# Patient Record
Sex: Female | Born: 1955
Health system: Southern US, Community
[De-identification: ages and names within clinical notes are randomized; demographics above are authoritative.]

## PROBLEM LIST (undated history)

## (undated) DIAGNOSIS — R7303 Prediabetes: Secondary | ICD-10-CM

## (undated) DIAGNOSIS — F419 Anxiety disorder, unspecified: Secondary | ICD-10-CM

## (undated) DIAGNOSIS — F32A Depression, unspecified: Secondary | ICD-10-CM

## (undated) DIAGNOSIS — E7800 Pure hypercholesterolemia, unspecified: Secondary | ICD-10-CM

## (undated) DIAGNOSIS — I10 Essential (primary) hypertension: Secondary | ICD-10-CM

## (undated) DIAGNOSIS — E079 Disorder of thyroid, unspecified: Secondary | ICD-10-CM

## (undated) DIAGNOSIS — K219 Gastro-esophageal reflux disease without esophagitis: Secondary | ICD-10-CM

## (undated) DIAGNOSIS — F329 Major depressive disorder, single episode, unspecified: Secondary | ICD-10-CM

## (undated) HISTORY — PX: ABDOMINAL HYSTERECTOMY: SHX81

## (undated) HISTORY — PX: TUBAL LIGATION: SHX77

## (undated) HISTORY — PX: CHOLECYSTECTOMY: SHX55

---

## 2006-02-17 ENCOUNTER — Ambulatory Visit (HOSPITAL_COMMUNITY): Admission: RE | Admit: 2006-02-17 | Discharge: 2006-02-17 | Payer: Self-pay | Admitting: Family Medicine

## 2008-03-28 ENCOUNTER — Emergency Department (HOSPITAL_COMMUNITY): Admission: EM | Admit: 2008-03-28 | Discharge: 2008-03-28 | Payer: Self-pay | Admitting: Emergency Medicine

## 2010-02-09 ENCOUNTER — Encounter: Payer: Self-pay | Admitting: Family Medicine

## 2010-05-01 LAB — RAPID STREP SCREEN (MED CTR MEBANE ONLY): Streptococcus, Group A Screen (Direct): NEGATIVE

## 2014-02-24 ENCOUNTER — Emergency Department (HOSPITAL_COMMUNITY): Payer: BLUE CROSS/BLUE SHIELD

## 2014-02-24 ENCOUNTER — Emergency Department (HOSPITAL_COMMUNITY)
Admission: EM | Admit: 2014-02-24 | Discharge: 2014-02-24 | Discharge status: Against Medical Advice | Payer: BLUE CROSS/BLUE SHIELD | Attending: Family Medicine | Admitting: Family Medicine

## 2014-02-24 ENCOUNTER — Encounter (HOSPITAL_COMMUNITY): Payer: Self-pay | Admitting: Emergency Medicine

## 2014-02-24 DIAGNOSIS — Z87891 Personal history of nicotine dependence: Secondary | ICD-10-CM | POA: Insufficient documentation

## 2014-02-24 DIAGNOSIS — I10 Essential (primary) hypertension: Secondary | ICD-10-CM | POA: Insufficient documentation

## 2014-02-24 DIAGNOSIS — E780 Pure hypercholesterolemia: Secondary | ICD-10-CM | POA: Insufficient documentation

## 2014-02-24 DIAGNOSIS — Y9289 Other specified places as the place of occurrence of the external cause: Secondary | ICD-10-CM | POA: Insufficient documentation

## 2014-02-24 DIAGNOSIS — R55 Syncope and collapse: Secondary | ICD-10-CM | POA: Diagnosis present

## 2014-02-24 DIAGNOSIS — Y998 Other external cause status: Secondary | ICD-10-CM | POA: Insufficient documentation

## 2014-02-24 DIAGNOSIS — W1839XA Other fall on same level, initial encounter: Secondary | ICD-10-CM | POA: Diagnosis not present

## 2014-02-24 DIAGNOSIS — E079 Disorder of thyroid, unspecified: Secondary | ICD-10-CM | POA: Diagnosis not present

## 2014-02-24 DIAGNOSIS — W19XXXA Unspecified fall, initial encounter: Secondary | ICD-10-CM

## 2014-02-24 DIAGNOSIS — F329 Major depressive disorder, single episode, unspecified: Secondary | ICD-10-CM | POA: Diagnosis not present

## 2014-02-24 DIAGNOSIS — Z8719 Personal history of other diseases of the digestive system: Secondary | ICD-10-CM | POA: Insufficient documentation

## 2014-02-24 DIAGNOSIS — Z79899 Other long term (current) drug therapy: Secondary | ICD-10-CM | POA: Insufficient documentation

## 2014-02-24 DIAGNOSIS — Z043 Encounter for examination and observation following other accident: Secondary | ICD-10-CM | POA: Insufficient documentation

## 2014-02-24 DIAGNOSIS — R197 Diarrhea, unspecified: Secondary | ICD-10-CM | POA: Diagnosis not present

## 2014-02-24 DIAGNOSIS — Y9301 Activity, walking, marching and hiking: Secondary | ICD-10-CM | POA: Diagnosis not present

## 2014-02-24 DIAGNOSIS — R112 Nausea with vomiting, unspecified: Secondary | ICD-10-CM | POA: Diagnosis not present

## 2014-02-24 DIAGNOSIS — E876 Hypokalemia: Secondary | ICD-10-CM

## 2014-02-24 DIAGNOSIS — F419 Anxiety disorder, unspecified: Secondary | ICD-10-CM | POA: Diagnosis not present

## 2014-02-24 HISTORY — DX: Pure hypercholesterolemia, unspecified: E78.00

## 2014-02-24 HISTORY — DX: Gastro-esophageal reflux disease without esophagitis: K21.9

## 2014-02-24 HISTORY — DX: Prediabetes: R73.03

## 2014-02-24 HISTORY — DX: Essential (primary) hypertension: I10

## 2014-02-24 HISTORY — DX: Depression, unspecified: F32.A

## 2014-02-24 HISTORY — DX: Major depressive disorder, single episode, unspecified: F32.9

## 2014-02-24 HISTORY — DX: Disorder of thyroid, unspecified: E07.9

## 2014-02-24 HISTORY — DX: Anxiety disorder, unspecified: F41.9

## 2014-02-24 LAB — CBC WITH DIFFERENTIAL/PLATELET
Basophils Absolute: 0 10*3/uL (ref 0.0–0.1)
Basophils Relative: 0 % (ref 0–1)
EOS ABS: 0 10*3/uL (ref 0.0–0.7)
EOS PCT: 0 % (ref 0–5)
HCT: 37.4 % (ref 36.0–46.0)
Hemoglobin: 12.2 g/dL (ref 12.0–15.0)
Lymphocytes Relative: 15 % (ref 12–46)
Lymphs Abs: 1.3 10*3/uL (ref 0.7–4.0)
MCH: 28 pg (ref 26.0–34.0)
MCHC: 32.6 g/dL (ref 30.0–36.0)
MCV: 86 fL (ref 78.0–100.0)
Monocytes Absolute: 0.5 10*3/uL (ref 0.1–1.0)
Monocytes Relative: 5 % (ref 3–12)
Neutro Abs: 7.2 10*3/uL (ref 1.7–7.7)
Neutrophils Relative %: 80 % — ABNORMAL HIGH (ref 43–77)
PLATELETS: 176 10*3/uL (ref 150–400)
RBC: 4.35 MIL/uL (ref 3.87–5.11)
RDW: 13.9 % (ref 11.5–15.5)
WBC: 9 10*3/uL (ref 4.0–10.5)

## 2014-02-24 LAB — COMPREHENSIVE METABOLIC PANEL
ALBUMIN: 3.6 g/dL (ref 3.5–5.2)
ALT: 37 U/L — AB (ref 0–35)
AST: 32 U/L (ref 0–37)
Alkaline Phosphatase: 72 U/L (ref 39–117)
Anion gap: 7 (ref 5–15)
BILIRUBIN TOTAL: 0.6 mg/dL (ref 0.3–1.2)
BUN: 13 mg/dL (ref 6–23)
CO2: 27 mmol/L (ref 19–32)
CREATININE: 0.96 mg/dL (ref 0.50–1.10)
Calcium: 7.9 mg/dL — ABNORMAL LOW (ref 8.4–10.5)
Chloride: 100 mmol/L (ref 96–112)
GFR calc Af Amer: 75 mL/min — ABNORMAL LOW (ref >90)
GFR calc non Af Amer: 64 mL/min — ABNORMAL LOW (ref >90)
GLUCOSE: 106 mg/dL — AB (ref 70–99)
Potassium: 2.7 mmol/L — CL (ref 3.5–5.1)
Sodium: 134 mmol/L — ABNORMAL LOW (ref 135–145)
Total Protein: 6.6 g/dL (ref 6.0–8.3)

## 2014-02-24 LAB — URINE MICROSCOPIC-ADD ON

## 2014-02-24 LAB — RAPID URINE DRUG SCREEN, HOSP PERFORMED
AMPHETAMINES: NOT DETECTED
BARBITURATES: NOT DETECTED
BENZODIAZEPINES: POSITIVE — AB
Cocaine: NOT DETECTED
Opiates: NOT DETECTED
Tetrahydrocannabinol: NOT DETECTED

## 2014-02-24 LAB — URINALYSIS, ROUTINE W REFLEX MICROSCOPIC
BILIRUBIN URINE: NEGATIVE
Glucose, UA: NEGATIVE mg/dL
Ketones, ur: NEGATIVE mg/dL
LEUKOCYTES UA: NEGATIVE
Nitrite: NEGATIVE
PROTEIN: NEGATIVE mg/dL
Specific Gravity, Urine: <1.005 — ABNORMAL LOW (ref 1.005–1.030)
UROBILINOGEN UA: 0.2 mg/dL (ref 0.0–1.0)
pH: 6 (ref 5.0–8.0)

## 2014-02-24 LAB — TROPONIN I: Troponin I: <0.03 ng/mL (ref <0.031)

## 2014-02-24 LAB — ETHANOL: Alcohol, Ethyl (B): <5 mg/dL (ref 0–9)

## 2014-02-24 LAB — MAGNESIUM: Magnesium: 1.7 mg/dL (ref 1.5–2.5)

## 2014-02-24 LAB — LIPASE, BLOOD: LIPASE: 18 U/L (ref 11–59)

## 2014-02-24 MED ORDER — POTASSIUM CHLORIDE CRYS ER 20 MEQ PO TBCR
40.0000 meq | EXTENDED_RELEASE_TABLET | Freq: Once | ORAL | Status: AC
Start: 2014-02-24 — End: 2014-02-24
  Administered 2014-02-24: 40 meq via ORAL
  Filled 2014-02-24: qty 2

## 2014-02-24 MED ORDER — POTASSIUM CHLORIDE 10 MEQ/100ML IV SOLN
10.0000 meq | Freq: Once | INTRAVENOUS | Status: AC
  Administered 2014-02-24: 10 meq via INTRAVENOUS
  Filled 2014-02-24: qty 100

## 2014-02-24 MED ORDER — SODIUM CHLORIDE 0.9 % IV BOLUS (SEPSIS)
500.0000 mL | Freq: Once | INTRAVENOUS | Status: AC
  Administered 2014-02-24: 500 mL via INTRAVENOUS

## 2014-02-24 MED ORDER — SODIUM CHLORIDE 0.9 % IV SOLN
INTRAVENOUS | Status: DC
  Administered 2014-02-24: 15:00:00 via INTRAVENOUS

## 2014-02-24 MED ORDER — ONDANSETRON HCL 4 MG/2ML IJ SOLN: 4.0000 mg | INTRAMUSCULAR | Status: DC | PRN

## 2014-02-24 MED ORDER — FENTANYL CITRATE 0.05 MG/ML IJ SOLN
12.5000 ug | INTRAMUSCULAR | Status: AC | PRN
  Administered 2014-02-24 (×2): 12.5 ug via INTRAVENOUS
  Filled 2014-02-24: qty 2

## 2014-02-24 MED ORDER — FENTANYL CITRATE 0.05 MG/ML IJ SOLN
INTRAMUSCULAR | Status: AC
  Administered 2014-02-24: 12.5 ug via INTRAVENOUS
  Filled 2014-02-24: qty 2

## 2014-02-24 MED ORDER — ONDANSETRON HCL 4 MG PO TABS: 4.0000 mg | ORAL_TABLET | Freq: Three times a day (TID) | ORAL | Status: DC | PRN

## 2014-02-24 NOTE — ED Notes (Signed)
Pt was given original discharge paperwork to sign instead of AMA paperwork to sign. This was corrected by having pt sign AMA discharge paperwork.

## 2014-02-24 NOTE — Discharge Instructions (Signed)
°Emergency Department Resource Guide °1) Find a Doctor and Pay Out of Pocket °Although you won't have to find out who is covered by your insurance plan, it is a good idea to ask around and get recommendations. You will then need to call the office and see if the doctor you have chosen will accept you as a new patient and what types of options they offer for patients who are self-pay. Some doctors offer discounts or will set up payment plans for their patients who do not have insurance, but you will need to ask so you aren't surprised when you get to your appointment. ° °2) Contact Your Local Health Department °Not all health departments have doctors that can see patients for sick visits, but many do, so it is worth a call to see if yours does. If you don't know where your local health department is, you can check in your phone book. The CDC also has a tool to help you locate your state's health department, and many state websites also have listings of all of their local health departments. ° °3) Find a Walk-in Clinic °If your illness is not likely to be very severe or complicated, you may want to try a walk in clinic. These are popping up all over the country in pharmacies, drugstores, and shopping centers. They're usually staffed by nurse practitioners or physician assistants that have been trained to treat common illnesses and complaints. They're usually fairly quick and inexpensive. However, if you have serious medical issues or chronic medical problems, these are probably not your best option. ° °No Primary Care Doctor: °- Call [facility] at  832-8000 - they can help you locate a primary care doctor that  accepts your insurance, provides certain services, etc. °- Physician Referral Service- 1-800-533-3463 ° °Chronic Pain Problems: °Organization         Address  Phone   Notes  °[facility]  (336) 297-2271 Patients need to be referred by their primary care doctor.  ° °Medication  Assistance: °Organization         Address  Phone   Notes  °[place] [facility] [address] °[place], [place] [address] (336) 641-8030 --Must be a resident of [place] °-- Must have NO insurance coverage whatsoever (no Medicaid/ Medicare, etc.) °-- The pt. MUST have a primary care doctor that directs their care regularly and follows them in the community °  °[facility]  (866) 331-1348   °[employer]  (888) 892-1162   ° °Agencies that provide inexpensive medical care: °Organization         Address  Phone   Notes  °[facility]  (336) 832-8035   °[facility]    (336) 832-7272   °[facility] [address] °[place], [place] [address] (336) 832-4777   °[facility] [address], °[place] (336) 271-4999   °[facility]    (336) 373-0678   °[facility]    (336) 272-1050   °[facility] ° [address], [place] Phone:  (336) 832-4444, Fax:  (336) 832-4440 Hours of Operation:  9 am - 6 pm, M-F.  Also accepts Medicaid/Medicare and self-pay.  °[facility] ° [address], [place] Phone: (336) 832-3150, Fax: (336) 832-3151. Hours of Operation:  8:30 am - 5:30 pm, M-F.  Also accepts Medicaid and self-pay.  °[facility] [place] [address], [place] Phone: (336) 878-6027   °[facility] [address], [place], [place] (336)723-1848, Ext. 123 Mondays & Thursdays: 7-9 AM.  First 15 patients are seen on a first come, first serve basis. °  ° °Medicaid-accepting [place] Providers: ° °Organization         Address  Phone   Notes  °[facility] [address], [place] (336) 641-2100 Also accepts self-pay patients.  °[facility] [address], [place] ° (336) 856-9996   °[facility] [address], [place]  (336) 288-8857   °[facility] [address] [place] [address], [place] (336) 299-7000   °Veita Bland [address], [place]  ° (336) 373-1557 Only accepts [place] Access Medicaid patients after they have their name applied to their card.  ° °Self-Pay (no insurance) in [place]: ° °Organization         Address  Phone   Notes  °Sickle Cell Patients, [facility] [address], [place] (336) 832-1970   °[facility] [address], [place] (336) 832-4400   °[facility] ° [address], [place] (336) 992-4800   °[facility]/Dr. Osei-Bonsu ° [address] [place] [address], [place] or [address], [place] (336) 841-8500 Phone number for both [place] and [place] locations is the same.  °[facility] [address], [place] (336) 299-0000   °[facility] [address] [place] [address], [place] or [address] (336) 852-7530 °(336) 878-2260   °[facility] [address], [place] (336) 350-1642, phone; (336) 294-5005, fax Sees patients 1st and 3rd Saturday of every month.  Must not qualify for public or private insurance (i.e. Medicaid, Medicare, [place] Health Choice, Veterans' Benefits) • Household income should be no more than 200% of the poverty level •The clinic cannot treat you if you are pregnant or think you are pregnant • Sexually transmitted diseases are not treated at the clinic.  ° ° °Dental Care: °Organization         Address  Phone  Notes  °[place] [facility] [address], [place] (336) 641-6152 Accepts children up to age 21 who are enrolled in Medicaid or [place] Health Choice; pregnant women with a Medicaid card; and children who have applied for Medicaid or [place] Health Choice, but were declined, whose parents can pay a reduced fee at time of service.  °[place]  [facility] [place]  [address], [place] (336) 641-7733 Accepts children up to age 21 who are enrolled in Medicaid or [place] Health Choice; pregnant women with a Medicaid card; and children who have applied for Medicaid or [place] Health Choice, but were declined, whose parents can pay a reduced fee at time of service.  °[facility] ° [address], [place] (336) 641-4533 Patients are seen by appointment only. Walk-ins are not accepted. [facility] will see patients 18 years of age and older. °Monday - Tuesday (8am-5pm) °Most Wednesdays (8:30-5pm) °$30 per visit, cash only  °[facility] ° [address], [place] (336) 641-4533 Patients are seen by appointment only. Walk-ins are not accepted. [facility] will see patients 18 years of age and older. °One   Wednesday Evening (Monthly: Volunteer Based).  $30 per visit, cash only  °[facility]  (919) 537-3737 for adults; Children under age 4, call Graduate Pediatric Dentistry at ([phone]) [phone]. Children aged 4-14, please call (919) [phone] to request a pediatric application. ° Dental services are provided in all areas of dental care including fillings, crowns and bridges, complete and partial dentures, implants, gum treatment, root canals, and extractions. Preventive care is also provided. Treatment is provided to both adults and children. °Patients are selected via a lottery and there is often a waiting list. °  °[facility] [address], °[place] ° (336) 763-8833 [email or web address] °  °[facility] [address], [place], [place] (336)723-1848, Ext. 123 Second and Fourth Thursday of each month, opens at 6:30 AM; Clinic ends at 9 AM.  Patients are seen on a first-come first-served basis, and a limited number are seen during each clinic.  ° °[facility] ° [address], [place], [place] (336) 723-7904    Eligibility Requirements °You must have lived in [place], [place], or [place] counties for at least the last three months. °  You cannot be eligible for state or federal sponsored healthcare insurance, including Veterans Administration, Medicaid, or Medicare. °  You generally cannot be eligible for healthcare insurance through your employer.  °  How to apply: °Eligibility screenings are held every Tuesday and Wednesday afternoon from 1:00 pm until 4:00 pm. You do not need an appointment for the interview!  °[facility] [address], [place], [place] 336-631-2330   °[facility]  336-342-8273   °[facility]  336-703-3100   °[facility]  336-570-6415   ° °Behavioral Health Resources in the Community: °Intensive Outpatient Programs °Organization         Address  Phone  Notes  °[facility] [address], [place], [place] 336-878-6098   °[facility] [address], [place], [place] 336-832-9800   °[facility] [address], [place], [place] ° 336-882-2125   °[facility] [address],  °[place], [place] 1-800-853-5163 or 336-641-4981   °Substance Abuse Resources °Organization         Address  Phone  Notes  °[facility]  336-882-2125   °[facility]  336-784-9470   °[facility]  336-285-9073   °[facility]  336-845-3988   °Residential & Outpatient Substance Abuse Program  1-800-659-3381   °Psychological Services °Organization         Address  Phone  Notes  °[facility]  336- 832-9600   °[facility]  336- 378-7881   °[facility] [address], [place] 1-800-853-5163 or 336-641-4981   ° °Mobile Crisis Teams °Organization         Address  Phone  Notes  °[facility], Mobile Crisis Care Unit  1-877-626-1772   °Assertive °[facility] ° [address]  [place], [place] 336-834-9664   °Sharon DeEsch [address] °[place] 336-554-5454   ° °Self-Help/Support Groups °Organization         Address  Phone             Notes  °[facility] of  - variety of support groups  336- 373-1402 Call for more information  °Narcotics Anonymous (NA), [facility] [address], °[place]  2 meetings at this location  ° °  Residential Treatment Programs Organization         Address  Phone  Notes  [facility] [address],    [place]  [phone]   [facility]  [address], [place] [id number], [place], [place] [phone]   [facility] [address], [place] [phone] Admissions: 8am-3pm M-F  [facility] [address]    [place], [place] [phone]   [facility] [address], [place], [place] [phone]   [facility] [address]  [place], [place] [phone]   [facility] Intensive Outpatient [address], [place], [place] [phone]   [facility] [address]  [place], [place] [phone] or [phone](979)[phone]   [facility] [address] [place], [place] [phone](716)052-6858 Accepts Medicaid  [facility] [address]  [place] [phone]) [phone] Substance Abuse/Addiction Treatment   [facility]         Address  Phone  Notes  [facility]  (640)133-2947(888) [phone]   Angie FavaJulie Brannon, PhD [address], [address] [place], [place]   [phone](336) [phone] or (907)[phone](336) [phone]   [facility]    [address] [place] [address] [place], [place] (714)[phone](336) [phone]   [facility] [address], [place], [place] (325) [phone](336) (224) [phone] Insurance/Medicaid/sponsorship through [facility] [address]                                    [place], [place] [phone](336) ([phone]) [phone] Therapy/tele-psych/case    [facility] [address]   [place], [place] [phone]) [phone]    Dr. Lolly MustacheArfeen  [phone](336) [phone]   [facility] [place]  [employer] [facility] 1) [address], [place] 2) [address], [place] 3)  [address], [place] [phone](336) [phone](336) [phone]  [phone](336) [phone]   [facility] Child Abuse Hotline 614-715-7706(336) 5713154293 or (902)[phone](336) [phone] (After Hours)      Take the prescription as directed.  Increase your fluid intake (ie:  Gatoraide) for the next few days.  Eat a bland diet and advance to your regular diet slowly as you can tolerate it.   Avoid full strength juices, as well as milk and milk products until your diarrhea has resolved.   Call your regular medical doctor Monday to schedule a follow up appointment in the next 3 days to re-check your potassium and your EKG.  Return to the Emergency Department immediately if not improving (or even worsening) despite taking the medicines as prescribed, any black or bloody stool or vomit, if you develop a fever over "101," or for any other concerns.

## 2014-02-24 NOTE — ED Notes (Signed)
Patient c/o nausea, vomiting, and diarrhea that started Friday morning. Per patient fatigue with syncopal episodes. Patient states staggering when walking and multiple falls. Patient does not believe syncopal episodes last long unsure of hitting head. Denies any fevers or urinary symptoms. Per patient taking tylenol and xanax.

## 2014-02-24 NOTE — ED Provider Notes (Signed)
CSN: [id number]     Arrival date & time 02/24/14  1310 History   First MD Initiated Contact with Patient 02/24/14 1323     Chief Complaint  Patient presents with  . Emesis  . Loss of Consciousness      HPI Pt was seen at 1340. Per pt, c/o gradual onset and persistence of multiple intermittent episodes of N/V/D that began yesterday morning. Describes the stools as "watery." States she has been "passing out" multiple times since yesterday. States she "just is walking then I'm on the floor." Denies abd pain, no CP/palpitations, no SOB, no back pain, no fevers, no black or blood in stools or emesis, no focal motor weakness, no tingling/numbness in extremities.     Past Medical History  Diagnosis Date  . Thyroid disease   . High cholesterol   . Hypertension   . Depression   . Anxiety   . GERD (gastroesophageal reflux disease)   . Borderline diabetes    Past Surgical History  Procedure Laterality Date  . Abdominal hysterectomy    . Cholecystectomy    . Cesarean section     Family History  Problem Relation Age of Onset  . Diabetes Father    History  Substance Use Topics  . Smoking status: Former Smoker -- 1.00 packs/day for 20 years    Types: Cigarettes    Quit date: 01/19/2005  . Smokeless tobacco: Never Used  . Alcohol Use: No   OB History    Gravida Para Term Preterm AB TAB SAB Ectopic Multiple Living   Review of Systems ROS: Statement: All systems negative except as marked or noted in the HPI; Constitutional: Negative for fever and chills. ; ; Eyes: Negative for eye pain, redness and discharge. ; ; ENMT: Negative for ear pain, hoarseness, nasal congestion, sinus pressure and sore throat. ; ; Cardiovascular: Negative for chest pain, palpitations, diaphoresis, dyspnea and peripheral edema. ; ; Respiratory: Negative for cough, wheezing and stridor. ; ; Gastrointestinal: +N/V/D. Negative for abdominal pain, blood in stool, hematemesis, jaundice and rectal  bleeding. . ; ; Genitourinary: Negative for dysuria, flank pain and hematuria. ; ; Musculoskeletal: Negative for back pain and neck pain. Negative for swelling and trauma.; ; Skin: Negative for pruritus, rash, abrasions, blisters, bruising and skin lesion.; ; Neuro: Negative for headache, lightheadedness and neck stiffness. Negative for weakness, extremity weakness, paresthesias, involuntary movement, seizure and +syncope.      Allergies  Review of patient's allergies indicates no known allergies.  Home Medications   Prior to Admission medications   Medication Sig Start Date End Date Taking? Authorizing Provider  ALPRAZolam Prudy Feeler) 1 MG tablet Take 1 tablet by mouth 2 (two) times daily as needed for anxiety or sleep.  02/01/14  Yes Historical Provider, MD  citalopram (CELEXA) 20 MG tablet Take 1 tablet by mouth daily. 02/02/14  Yes Historical Provider, MD  ibuprofen (ADVIL,MOTRIN) 200 MG tablet Take 200 mg by mouth every 6 (six) hours as needed for mild pain.   Yes Historical Provider, MD  levothyroxine (SYNTHROID, LEVOTHROID) 75 MCG tablet Take 75 mcg by mouth daily before breakfast.   Yes Historical Provider, MD  lisinopril-hydrochlorothiazide (PRINZIDE,ZESTORETIC) 10-12.5 MG per tablet Take 1 tablet by mouth daily. 02/01/14  Yes Historical Provider, MD  simvastatin (ZOCOR) 20 MG tablet Take 1 tablet by mouth at bedtime. 02/01/14  Yes Historical Provider, MD  zolpidem (AMBIEN) 10 MG tablet Take 1  tablet by mouth at bedtime as needed for sleep.  01/27/14  Yes Historical Provider, MD   BP 116/67 mmHg  Pulse 89  Temp(Src) 98.8 F (37.1 C) (Oral)  Resp 17  Ht 5' 6.5" (1.689 m)  Wt 180 lb (81.647 kg)  BMI 28.62 kg/m2  SpO2 96% Physical Exam  1345; Physical examination:  Nursing notes reviewed; Vital signs and O2 SAT reviewed;  Constitutional: Well developed, Well nourished, In no acute distress; Head:  Normocephalic, atraumatic; Eyes: EOMI, PERRL, No scleral icterus; ENMT: Mouth and pharynx  normal, Mucous membranes dry; Neck: Supple, Full range of motion, No lymphadenopathy; Cardiovascular: Regular rate and rhythm, No gallop; Respiratory: Breath sounds clear & equal bilaterally, No rales, rhonchi, wheezes.  Speaking full sentences with ease, Normal respiratory effort/excursion; Chest: Nontender, Movement normal; Abdomen: Soft, Nontender, Nondistended, Normal bowel sounds; Genitourinary: No CVA tenderness; Spine:  No midline CS, TS, LS tenderness.;; Extremities: Pulses normal, No tenderness, No edema, No calf edema or asymmetry.; Neuro: AA&Ox3, Major CN grossly intact.  Speech clear. No facial droop. No gross focal motor or sensory deficits in extremities.; Skin: Color normal, Warm, Dry.   ED Course  Procedures     EKG Interpretation   Date/Time:  Saturday February 24 2014 13:32:10 EST Ventricular Rate:  84 PR Interval:  194 QRS Duration: 90 QT Interval:  380 QTC Calculation: 449 R Axis:   29 Text Interpretation:  Sinus rhythm Low voltage, precordial leads Abnormal  R-wave progression, early transition Borderline T abnormalities, anterior  leads Baseline wander No old tracing to compare Confirmed by [facility]  MD,  Nicholos Johns [phone]) on 02/24/2014 1:55:15 PM      MDM  MDM Reviewed: previous chart, nursing note and vitals Reviewed previous: labs Interpretation: labs, ECG and x-ray     Results for orders placed or performed during the hospital encounter of 02/24/14  CBC with Differential  Result Value Ref Range   WBC 9.0 4.0 - 10.5 K/uL   RBC 4.35 3.87 - 5.11 MIL/uL   Hemoglobin 12.2 12.0 - 15.0 g/dL   HCT 60.4 54.0 - 98.1 %   MCV 86.0 78.0 - 100.0 fL   MCH 28.0 26.0 - 34.0 pg   MCHC 32.6 30.0 - 36.0 g/dL   RDW 19.1 47.8 - 29.5 %   Platelets 176 150 - 400 K/uL   Neutrophils Relative % 80 (H) 43 - 77 %   Neutro Abs 7.2 1.7 - 7.7 K/uL   Lymphocytes Relative 15 12 - 46 %   Lymphs Abs 1.3 0.7 - 4.0 K/uL   Monocytes Relative 5 3 - 12 %   Monocytes Absolute 0.5 0.1 -  1.0 K/uL   Eosinophils Relative 0 0 - 5 %   Eosinophils Absolute 0.0 0.0 - 0.7 K/uL   Basophils Relative 0 0 - 1 %   Basophils Absolute 0.0 0.0 - 0.1 K/uL  Comprehensive metabolic panel  Result Value Ref Range   Sodium 134 (L) 135 - 145 mmol/L   Potassium 2.7 (LL) 3.5 - 5.1 mmol/L   Chloride 100 96 - 112 mmol/L   CO2 27 19 - 32 mmol/L   Glucose, Bld 106 (H) 70 - 99 mg/dL   BUN 13 6 - 23 mg/dL   Creatinine, Ser 6.21 0.50 - 1.10 mg/dL   Calcium 7.9 (L) 8.4 - 10.5 mg/dL   Total Protein 6.6 6.0 - 8.3 g/dL   Albumin 3.6 3.5 - 5.2 g/dL   AST 32 0 - 37 U/L   ALT 37 (  H) 0 - 35 U/L   Alkaline Phosphatase 72 39 - 117 U/L   Total Bilirubin 0.6 0.3 - 1.2 mg/dL   GFR calc non Af Amer 64 (L) >90 mL/min   GFR calc Af Amer 75 (L) >90 mL/min   Anion gap 7 5 - 15  Lipase, blood  Result Value Ref Range   Lipase 18 11 - 59 U/L  Troponin I  Result Value Ref Range   Troponin I <0.03 <0.031 ng/mL  Urinalysis, Routine w reflex microscopic  Result Value Ref Range   Color, Urine YELLOW YELLOW   APPearance CLEAR CLEAR   Specific Gravity, Urine <1.005 (L) 1.005 - 1.030   pH 6.0 5.0 - 8.0   Glucose, UA NEGATIVE NEGATIVE mg/dL   Hgb urine dipstick SMALL (A) NEGATIVE   Bilirubin Urine NEGATIVE NEGATIVE   Ketones, ur NEGATIVE NEGATIVE mg/dL   Protein, ur NEGATIVE NEGATIVE mg/dL   Urobilinogen, UA 0.2 0.0 - 1.0 mg/dL   Nitrite NEGATIVE NEGATIVE   Leukocytes, UA NEGATIVE NEGATIVE  Ethanol  Result Value Ref Range   Alcohol, Ethyl (B) <5 0 - 9 mg/dL  Urine rapid drug screen (hosp performed)  Result Value Ref Range   Opiates NONE DETECTED NONE DETECTED   Cocaine NONE DETECTED NONE DETECTED   Benzodiazepines POSITIVE (A) NONE DETECTED   Amphetamines NONE DETECTED NONE DETECTED   Tetrahydrocannabinol NONE DETECTED NONE DETECTED   Barbiturates NONE DETECTED NONE DETECTED  Urine microscopic-add on  Result Value Ref Range   Squamous Epithelial / LPF FEW (A) RARE   WBC, UA 0-2 <3 WBC/hpf   RBC / HPF  0-2 <3 RBC/hpf   Bacteria, UA FEW (A) RARE  Magnesium  Result Value Ref Range   Magnesium 1.7 1.5 - 2.5 mg/dL   Ct Head Wo Contrast 01/24/1094   CLINICAL DATA:  Multiple falls, loss of consciousness, vomiting  EXAM: CT HEAD WITHOUT CONTRAST  TECHNIQUE: Contiguous axial images were obtained from the base of the skull through the vertex without intravenous contrast.  COMPARISON:  None.  FINDINGS: No evidence of parenchymal hemorrhage or extra-axial fluid collection. No mass lesion, mass effect, or midline shift.  No CT evidence of acute infarction.  Right basal ganglia lacunar infarct, likely chronic.  Cerebral volume is within normal limits.  No ventriculomegaly.  The visualized paranasal sinuses are essentially clear. The mastoid air cells are unopacified.  No evidence of calvarial fracture.  IMPRESSION: No evidence of acute intracranial abnormality.  Right basal ganglia lacunar infarct, likely chronic.   Electronically Signed   By: Charline Bills M.D.   On: 02/24/2014 14:22   Dg Abd Acute W/chest 02/24/2014   CLINICAL DATA:  Vomiting and loss of consciousness.  EXAM: ACUTE ABDOMEN SERIES (ABDOMEN 2 VIEW & CHEST 1 VIEW)  COMPARISON:  None.  FINDINGS: Lungs are adequately inflated with minimal bibasilar scarring. No focal consolidation or effusion. Cardiomediastinal silhouette is within normal. There are degenerative changes of the spine. Remainder of the chest is unremarkable.  Abdominal images demonstrate air throughout the colon with a few scattered colonic air-fluid levels. No evidence of dilated large or small bowel. No free peritoneal air. Surgical clips over the right upper quadrant. Two adjacent nonspecific calcifications over the right upper pelvis with the larger measuring 5 mm. Mild degenerative change of the spine and hips.  IMPRESSION: Nonobstructive bowel gas pattern.  No acute cardiopulmonary disease.   Electronically Signed   By: Elberta Fortis M.D.   On: 02/24/2014 14:38   Dg  Hip Unilat  With Pelvis 2-3 Views Right 02/24/2014   CLINICAL DATA:  Right hip pain, multiple falls  EXAM: RIGHT HIP (WITH PELVIS) 2-3 VIEWS  COMPARISON:  None.  FINDINGS: No fracture or dislocation is seen.  Bilateral hip joint spaces are symmetric.  Visualized bony pelvis appears intact.  IMPRESSION: No fracture or dislocation is seen.   Electronically Signed   By: Charline BillsSriyesh  Krishnan M.D.   On: 02/24/2014 16:56    1530:  Pt with labile VS. No N/V or stooling while in the ED. Abd remains benign. Potassium repleted IV. Pt requesting "some pain medication" because she "doesn't feel well" and now endorses right hip pain. Will XR. Dx and testing d/w pt.  Questions answered.  Verb understanding, agreeable to admit.    T/C to [facility] BlackwaterStinson, case discussed, including:  HPI, pertinent PM/SHx, VS/PE, dx testing, ED course and treatment:  Agreeable to evaluate in the ED for admission.   1715:  [facility]. Adrian BlackwaterStinson has evaluated pt: pt now states she "doesn't want to stay" and "wants to go home," states her "hip was just hurting because I was laying on it all day." Pt will need to ambulate and demonstrate she can take PO before d/c.   1845:  Pt has vacillated back and forth between going home and wanting to be admitted. Pt now states she wants to go home. Pt has ambulated with steady gait and tol PO without N/V. Has not stooled while in the ED.  Pt informed re: dx testing results, including EKG changes and that I recommend observation admission for further evaluation.  Pt refuses admission.  I encouraged pt to stay, continues to refuse.  Pt makes her own medical decisions.  Risks of AMA explained to pt, including, but not limited to:  stroke, heart attack, cardiac arrythmia ("irregular heart rate/beat"), "passing out," temporary and/or permanent disability, death.  Pt verb understanding and continues to refuse admission, understanding the consequences of her decision.  I encouraged pt to follow up with her PMD on Monday and return  to the ED immediately if symptoms return, worsen, or for any other concerns.  Pt and family verb understanding, agreeable.   Samuel JesterKathleen Trevontae Lindahl, DO 02/26/14 2120

## 2014-02-24 NOTE — ED Notes (Signed)
Pt was fine while ambulating. No dizziness.  Just stated that her hip was hurting form laying on it all day.

## 2014-06-05 ENCOUNTER — Emergency Department (HOSPITAL_COMMUNITY)
Admission: EM | Admit: 2014-06-05 | Discharge: 2014-06-05 | Disposition: A | Discharge status: Home / Self Care | Payer: BLUE CROSS/BLUE SHIELD | Attending: Emergency Medicine | Admitting: Emergency Medicine

## 2014-06-05 ENCOUNTER — Encounter (HOSPITAL_COMMUNITY): Payer: Self-pay | Admitting: Emergency Medicine

## 2014-06-05 DIAGNOSIS — R7401 Elevation of levels of liver transaminase levels: Secondary | ICD-10-CM

## 2014-06-05 DIAGNOSIS — F419 Anxiety disorder, unspecified: Secondary | ICD-10-CM | POA: Diagnosis not present

## 2014-06-05 DIAGNOSIS — R509 Fever, unspecified: Secondary | ICD-10-CM | POA: Diagnosis present

## 2014-06-05 DIAGNOSIS — K219 Gastro-esophageal reflux disease without esophagitis: Secondary | ICD-10-CM | POA: Diagnosis not present

## 2014-06-05 DIAGNOSIS — F329 Major depressive disorder, single episode, unspecified: Secondary | ICD-10-CM | POA: Insufficient documentation

## 2014-06-05 DIAGNOSIS — I10 Essential (primary) hypertension: Secondary | ICD-10-CM | POA: Diagnosis not present

## 2014-06-05 DIAGNOSIS — E079 Disorder of thyroid, unspecified: Secondary | ICD-10-CM | POA: Diagnosis not present

## 2014-06-05 DIAGNOSIS — Z87891 Personal history of nicotine dependence: Secondary | ICD-10-CM | POA: Insufficient documentation

## 2014-06-05 DIAGNOSIS — M791 Myalgia, unspecified site: Secondary | ICD-10-CM

## 2014-06-05 DIAGNOSIS — Z79899 Other long term (current) drug therapy: Secondary | ICD-10-CM | POA: Diagnosis not present

## 2014-06-05 DIAGNOSIS — R740 Nonspecific elevation of levels of transaminase and lactic acid dehydrogenase [LDH]: Secondary | ICD-10-CM | POA: Insufficient documentation

## 2014-06-05 LAB — COMPREHENSIVE METABOLIC PANEL
ALBUMIN: 4.2 g/dL (ref 3.5–5.0)
ALK PHOS: 105 U/L (ref 38–126)
ALT: 55 U/L — ABNORMAL HIGH (ref 14–54)
AST: 60 U/L — ABNORMAL HIGH (ref 15–41)
Anion gap: 9 (ref 5–15)
BILIRUBIN TOTAL: 0.7 mg/dL (ref 0.3–1.2)
BUN: 15 mg/dL (ref 6–20)
CHLORIDE: 98 mmol/L — AB (ref 101–111)
CO2: 27 mmol/L (ref 22–32)
Calcium: 9.1 mg/dL (ref 8.9–10.3)
Creatinine, Ser: 1.05 mg/dL — ABNORMAL HIGH (ref 0.44–1.00)
GFR calc Af Amer: >60 mL/min (ref >60)
GFR, EST NON AFRICAN AMERICAN: 58 mL/min — AB (ref >60)
Glucose, Bld: 133 mg/dL — ABNORMAL HIGH (ref 65–99)
POTASSIUM: 3.5 mmol/L (ref 3.5–5.1)
Sodium: 134 mmol/L — ABNORMAL LOW (ref 135–145)
Total Protein: 7.3 g/dL (ref 6.5–8.1)

## 2014-06-05 LAB — CBC WITH DIFFERENTIAL/PLATELET
Basophils Absolute: 0 10*3/uL (ref 0.0–0.1)
Basophils Relative: 0 % (ref 0–1)
EOS PCT: 0 % (ref 0–5)
Eosinophils Absolute: 0 10*3/uL (ref 0.0–0.7)
HCT: 38.6 % (ref 36.0–46.0)
Hemoglobin: 12.3 g/dL (ref 12.0–15.0)
LYMPHS ABS: 0.4 10*3/uL — AB (ref 0.7–4.0)
LYMPHS PCT: 3 % — AB (ref 12–46)
MCH: 28 pg (ref 26.0–34.0)
MCHC: 31.9 g/dL (ref 30.0–36.0)
MCV: 87.7 fL (ref 78.0–100.0)
Monocytes Absolute: 0.5 10*3/uL (ref 0.1–1.0)
Monocytes Relative: 4 % (ref 3–12)
NEUTROS PCT: 93 % — AB (ref 43–77)
Neutro Abs: 11.8 10*3/uL — ABNORMAL HIGH (ref 1.7–7.7)
PLATELETS: 214 10*3/uL (ref 150–400)
RBC: 4.4 MIL/uL (ref 3.87–5.11)
RDW: 13.3 % (ref 11.5–15.5)
WBC: 12.7 10*3/uL — AB (ref 4.0–10.5)

## 2014-06-05 LAB — URINE MICROSCOPIC-ADD ON

## 2014-06-05 LAB — URINALYSIS, ROUTINE W REFLEX MICROSCOPIC
Bilirubin Urine: NEGATIVE
GLUCOSE, UA: NEGATIVE mg/dL
Ketones, ur: NEGATIVE mg/dL
LEUKOCYTES UA: NEGATIVE
Nitrite: NEGATIVE
PH: 6 (ref 5.0–8.0)
Protein, ur: NEGATIVE mg/dL
SPECIFIC GRAVITY, URINE: 1.02 (ref 1.005–1.030)
Urobilinogen, UA: 0.2 mg/dL (ref 0.0–1.0)

## 2014-06-05 MED ORDER — DOXYCYCLINE HYCLATE 100 MG PO CAPS: 100.0000 mg | ORAL_CAPSULE | Freq: Two times a day (BID) | ORAL | Status: DC

## 2014-06-05 MED ORDER — IBUPROFEN 800 MG PO TABS: 800.0000 mg | ORAL_TABLET | Freq: Three times a day (TID) | ORAL | Status: DC

## 2014-06-05 MED ORDER — DOXYCYCLINE HYCLATE 100 MG PO TABS
100.0000 mg | ORAL_TABLET | Freq: Once | ORAL | Status: AC
  Administered 2014-06-05: 100 mg via ORAL
  Filled 2014-06-05: qty 1

## 2014-06-05 MED ORDER — ACETAMINOPHEN 500 MG PO TABS
1000.0000 mg | ORAL_TABLET | Freq: Once | ORAL | Status: AC
  Administered 2014-06-05: 1000 mg via ORAL
  Filled 2014-06-05: qty 2

## 2014-06-05 NOTE — ED Notes (Signed)
PT states she went to bed after working 3rd shift last night and woke up around 1400 today with a temperature of 104.0. PT states she started an antibiotic on Friday for UTI and is having painful urination.

## 2014-06-05 NOTE — ED Notes (Signed)
Patient with no complaints at this time. Respirations even and unlabored. Skin warm/dry. Discharge instructions reviewed with patient at this time. Patient given opportunity to voice concerns/ask questions. Patient discharged at this time and left Emergency Department with steady gait.   

## 2014-06-05 NOTE — ED Provider Notes (Addendum)
CSN: [id number]     Arrival date & time 06/05/14  1825 History   This chart was scribed for Eber Hong, MD by Modena Jansky, ED Scribe. This patient was seen in room APA08/APA08 and the patient's care was started at 8:48 PM.   Chief Complaint  Patient presents with  . Fever   HPI HPI Comments: Catherine Madden is a 59 y.o. female who presents to the Emergency Department complaining of a constant fever that started about 7 hours ago. She states that she woke up after working a night shift with her whole body shivering. She reports that she went back to sleep and woke up 1.5 hours later still shivering with a fever of 104. She states that she had no treatment PTA. She reports that she has been on antibiotics due to urinary problems. She report having a tick bite a few months ago. She states that she had her flu shot this year. She denies any recent travel, or hx of HIV or AIDs. She also denies any diarrhea, cough, sore throat, nausea, vomiting, rash, diarrhea, abdominal pain, ear pain, or spine pain.  No rash.  Sx are constant, improved after tylenol.  Past Medical History  Diagnosis Date  . Thyroid disease   . High cholesterol   . Hypertension   . Depression   . Anxiety   . GERD (gastroesophageal reflux disease)   . Borderline diabetes    Past Surgical History  Procedure Laterality Date  . Abdominal hysterectomy    . Cholecystectomy    . Cesarean section     Family History  Problem Relation Age of Onset  . Diabetes Father    History  Substance Use Topics  . Smoking status: Former Smoker -- 1.00 packs/day for 20 years    Types: Cigarettes    Quit date: 01/19/2005  . Smokeless tobacco: Never Used  . Alcohol Use: No   OB History    Gravida Para Term Preterm AB TAB SAB Ectopic Multiple Living   Review of Systems  All other systems reviewed and are negative.     Allergies  Review of patient's allergies indicates no known allergies.  Home Medications    Prior to Admission medications   Medication Sig Start Date End Date Taking? Authorizing Provider  ALPRAZolam Prudy Feeler) 1 MG tablet Take 1 tablet by mouth 2 (two) times daily as needed for anxiety or sleep.  02/01/14   Historical Provider, MD  citalopram (CELEXA) 20 MG tablet Take 1 tablet by mouth daily. 02/02/14   Historical Provider, MD  doxycycline (VIBRAMYCIN) 100 MG capsule Take 1 capsule (100 mg total) by mouth 2 (two) times daily. 06/05/14   Eber Hong, MD  ibuprofen (ADVIL,MOTRIN) 800 MG tablet Take 1 tablet (800 mg total) by mouth 3 (three) times daily. 06/05/14   Eber Hong, MD  levothyroxine (SYNTHROID, LEVOTHROID) 75 MCG tablet Take 75 mcg by mouth daily before breakfast.    Historical Provider, MD  levothyroxine (SYNTHROID, LEVOTHROID) 88 MCG tablet Take 88 mcg by mouth daily. 05/21/14   Historical Provider, MD  lisinopril-hydrochlorothiazide (PRINZIDE,ZESTORETIC) 10-12.5 MG per tablet Take 1 tablet by mouth daily. 02/01/14   Historical Provider, MD  omeprazole (PRILOSEC) 40 MG capsule Take 40 mg by mouth daily. 04/02/14   Historical Provider, MD  ondansetron (ZOFRAN) 4 MG tablet Take 1 tablet (4 mg total) by mouth every 8 (eight) hours as needed for nausea or vomiting.  02/24/14   Samuel JesterKathleen McManus, DO  simvastatin (ZOCOR) 20 MG tablet Take 1 tablet by mouth at bedtime. 02/01/14   Historical Provider, MD  zolpidem (AMBIEN) 10 MG tablet Take 1 tablet by mouth at bedtime as needed for sleep.  01/27/14   Historical Provider, MD   BP 127/73 mmHg  Pulse 113  Temp(Src) 100 F (37.8 C) (Oral)  Resp 18  Ht 5' 5.5" (1.664 m)  Wt 190 lb (86.183 kg)  BMI 31.13 kg/m2  SpO2 96% Physical Exam  Constitutional: She appears well-developed and well-nourished. No distress.  HENT:  Head: Normocephalic and atraumatic.  Mouth/Throat: Oropharynx is clear and moist. No oropharyngeal exudate.  Eyes: Conjunctivae and EOM are normal. Pupils are equal, round, and reactive to light. Right eye exhibits no  discharge. Left eye exhibits no discharge. No scleral icterus.  Neck: Normal range of motion. Neck supple. No JVD present. No thyromegaly present.  Cardiovascular: Normal rate, regular rhythm, normal heart sounds and intact distal pulses.  Exam reveals no gallop and no friction rub.   No murmur heard. Pulmonary/Chest: Effort normal and breath sounds normal. No respiratory distress. She has no wheezes. She has no rales.  Abdominal: Soft. Bowel sounds are normal. She exhibits no distension and no mass. There is no tenderness.  Musculoskeletal: Normal range of motion. She exhibits no edema or tenderness.  Lymphadenopathy:    She has no cervical adenopathy.  Neurological: She is alert. Coordination normal.  Skin: Skin is warm and dry. No rash noted. No erythema.  Psychiatric: She has a normal mood and affect. Her behavior is normal.  Nursing note and vitals reviewed.   ED Course  Procedures (including critical care time) Labs Review Labs Reviewed  URINALYSIS, ROUTINE W REFLEX MICROSCOPIC - Abnormal; Notable for the following:    Hgb urine dipstick TRACE (*)    All other components within normal limits  CBC WITH DIFFERENTIAL/PLATELET - Abnormal; Notable for the following:    WBC 12.7 (*)    Neutrophils Relative % 93 (*)    Neutro Abs 11.8 (*)    Lymphocytes Relative 3 (*)    Lymphs Abs 0.4 (*)    All other components within normal limits  COMPREHENSIVE METABOLIC PANEL - Abnormal; Notable for the following:    Sodium 134 (*)    Chloride 98 (*)    Glucose, Bld 133 (*)    Creatinine, Ser 1.05 (*)    AST 60 (*)    ALT 55 (*)    GFR calc non Af Amer 58 (*)    All other components within normal limits  URINE MICROSCOPIC-ADD ON - Abnormal; Notable for the following:    Squamous Epithelial / LPF FEW (*)    All other components within normal limits    Imaging Review No results found.   EKG Interpretation None      MDM   Final diagnoses:  High fever  Myalgia  Transaminitis     Labs with mild transaminitis and low Na with WBC and hx could be c/w RMSF - pt appears very well at this time - pulse of 100 on exam, taking PO, no rash - will tx withi doxy X 2 week for RMSF, pt in agreement.  Meds given in ED:  Medications  doxycycline (VIBRA-TABS) tablet 100 mg (not administered)  acetaminophen (TYLENOL) tablet 1,000 mg (1,000 mg Oral Given 06/05/14 1840)    New Prescriptions   DOXYCYCLINE (VIBRAMYCIN) 100 MG CAPSULE    Take 1 capsule (100 mg total)  by mouth 2 (two) times daily.   IBUPROFEN (ADVIL,MOTRIN) 800 MG TABLET    Take 1 tablet (800 mg total) by mouth 3 (three) times daily.   I personally performed the services described in this documentation, which was scribed in my presence. The recorded information has been reviewed and is accurate.       Eber HongBrian Bekki Tavenner, MD 06/05/14 2118  Eber HongBrian Creg Gilmer, MD 06/05/14 2118

## 2014-06-05 NOTE — Discharge Instructions (Signed)
Please call your doctor for a followup appointment within 24-48 hours. When you talk to your doctor please let them know that you were seen in the emergency department and have them acquire all of your records so that they can discuss the findings with you and formulate a treatment plan to fully care for your new and ongoing problems. ° °

## 2015-05-02 DIAGNOSIS — E038 Other specified hypothyroidism: Secondary | ICD-10-CM | POA: Diagnosis not present

## 2015-05-07 DIAGNOSIS — F419 Anxiety disorder, unspecified: Secondary | ICD-10-CM | POA: Diagnosis not present

## 2015-05-07 DIAGNOSIS — E782 Mixed hyperlipidemia: Secondary | ICD-10-CM | POA: Diagnosis not present

## 2015-05-07 DIAGNOSIS — F329 Major depressive disorder, single episode, unspecified: Secondary | ICD-10-CM | POA: Diagnosis not present

## 2015-05-07 DIAGNOSIS — E039 Hypothyroidism, unspecified: Secondary | ICD-10-CM | POA: Diagnosis not present

## 2015-06-27 DIAGNOSIS — E039 Hypothyroidism, unspecified: Secondary | ICD-10-CM | POA: Diagnosis not present

## 2015-08-22 DIAGNOSIS — H35033 Hypertensive retinopathy, bilateral: Secondary | ICD-10-CM | POA: Diagnosis not present

## 2015-08-30 DIAGNOSIS — E039 Hypothyroidism, unspecified: Secondary | ICD-10-CM | POA: Diagnosis not present

## 2015-08-30 DIAGNOSIS — E782 Mixed hyperlipidemia: Secondary | ICD-10-CM | POA: Diagnosis not present

## 2015-08-30 DIAGNOSIS — E876 Hypokalemia: Secondary | ICD-10-CM | POA: Diagnosis not present

## 2015-08-30 DIAGNOSIS — I10 Essential (primary) hypertension: Secondary | ICD-10-CM | POA: Diagnosis not present

## 2015-09-04 DIAGNOSIS — E039 Hypothyroidism, unspecified: Secondary | ICD-10-CM | POA: Diagnosis not present

## 2015-09-04 DIAGNOSIS — F329 Major depressive disorder, single episode, unspecified: Secondary | ICD-10-CM | POA: Diagnosis not present

## 2015-09-04 DIAGNOSIS — F419 Anxiety disorder, unspecified: Secondary | ICD-10-CM | POA: Diagnosis not present

## 2015-09-04 DIAGNOSIS — E782 Mixed hyperlipidemia: Secondary | ICD-10-CM | POA: Diagnosis not present

## 2015-09-06 ENCOUNTER — Encounter (HOSPITAL_COMMUNITY): Admission: EM | Disposition: A | Discharge status: Home / Self Care | Payer: Self-pay | Source: Home / Self Care

## 2015-09-06 ENCOUNTER — Encounter: Payer: Self-pay | Admitting: Family

## 2015-09-06 ENCOUNTER — Inpatient Hospital Stay (HOSPITAL_COMMUNITY): Payer: Worker's Compensation | Admitting: Certified Registered Nurse Anesthetist

## 2015-09-06 ENCOUNTER — Ambulatory Visit (INDEPENDENT_AMBULATORY_CARE_PROVIDER_SITE_OTHER): Payer: Worker's Compensation | Admitting: Family

## 2015-09-06 ENCOUNTER — Emergency Department (HOSPITAL_COMMUNITY): Payer: Worker's Compensation

## 2015-09-06 ENCOUNTER — Encounter (HOSPITAL_COMMUNITY): Payer: Self-pay | Admitting: *Deleted

## 2015-09-06 ENCOUNTER — Other Ambulatory Visit: Payer: Self-pay | Admitting: Obstetrics and Gynecology

## 2015-09-06 ENCOUNTER — Ambulatory Visit (HOSPITAL_COMMUNITY)
Admission: EM | Admit: 2015-09-06 | Discharge: 2015-09-06 | Disposition: A | Discharge status: Home / Self Care | Payer: Worker's Compensation | Attending: Obstetrics and Gynecology | Admitting: Obstetrics and Gynecology

## 2015-09-06 VITALS — BP 137/89 | HR 74 | Temp 97.4°F | Ht 60.0 in | Wt 184.6 lb

## 2015-09-06 DIAGNOSIS — W458XXA Other foreign body or object entering through skin, initial encounter: Secondary | ICD-10-CM | POA: Insufficient documentation

## 2015-09-06 DIAGNOSIS — R7303 Prediabetes: Secondary | ICD-10-CM | POA: Insufficient documentation

## 2015-09-06 DIAGNOSIS — W228XXA Striking against or struck by other objects, initial encounter: Secondary | ICD-10-CM | POA: Diagnosis not present

## 2015-09-06 DIAGNOSIS — J449 Chronic obstructive pulmonary disease, unspecified: Secondary | ICD-10-CM | POA: Diagnosis not present

## 2015-09-06 DIAGNOSIS — F419 Anxiety disorder, unspecified: Secondary | ICD-10-CM | POA: Insufficient documentation

## 2015-09-06 DIAGNOSIS — K219 Gastro-esophageal reflux disease without esophagitis: Secondary | ICD-10-CM | POA: Diagnosis not present

## 2015-09-06 DIAGNOSIS — Z87891 Personal history of nicotine dependence: Secondary | ICD-10-CM | POA: Diagnosis not present

## 2015-09-06 DIAGNOSIS — E039 Hypothyroidism, unspecified: Secondary | ICD-10-CM | POA: Diagnosis not present

## 2015-09-06 DIAGNOSIS — W19XXXA Unspecified fall, initial encounter: Secondary | ICD-10-CM | POA: Diagnosis not present

## 2015-09-06 DIAGNOSIS — S39848A Other specified injuries of external genitals, initial encounter: Secondary | ICD-10-CM | POA: Diagnosis not present

## 2015-09-06 DIAGNOSIS — E7800 Pure hypercholesterolemia, unspecified: Secondary | ICD-10-CM | POA: Insufficient documentation

## 2015-09-06 DIAGNOSIS — Z90710 Acquired absence of both cervix and uterus: Secondary | ICD-10-CM | POA: Diagnosis not present

## 2015-09-06 DIAGNOSIS — S3140XA Unspecified open wound of vagina and vulva, initial encounter: Secondary | ICD-10-CM | POA: Diagnosis not present

## 2015-09-06 DIAGNOSIS — F329 Major depressive disorder, single episode, unspecified: Secondary | ICD-10-CM | POA: Insufficient documentation

## 2015-09-06 DIAGNOSIS — S3141XA Laceration without foreign body of vagina and vulva, initial encounter: Secondary | ICD-10-CM | POA: Diagnosis not present

## 2015-09-06 DIAGNOSIS — Z833 Family history of diabetes mellitus: Secondary | ICD-10-CM | POA: Diagnosis not present

## 2015-09-06 DIAGNOSIS — I10 Essential (primary) hypertension: Secondary | ICD-10-CM | POA: Diagnosis not present

## 2015-09-06 DIAGNOSIS — S3993XA Unspecified injury of pelvis, initial encounter: Secondary | ICD-10-CM

## 2015-09-06 DIAGNOSIS — S39013A Strain of muscle, fascia and tendon of pelvis, initial encounter: Secondary | ICD-10-CM

## 2015-09-06 HISTORY — PX: PERINEAL LACERATION REPAIR: SHX5389

## 2015-09-06 SURGERY — SUTURE REPAIR, LACERATION, PERINEUM
Anesthesia: General | Site: Vagina

## 2015-09-06 MED ORDER — EPHEDRINE 5 MG/ML INJ
INTRAVENOUS | Status: AC
  Filled 2015-09-06: qty 10

## 2015-09-06 MED ORDER — MIDAZOLAM HCL 2 MG/2ML IJ SOLN
INTRAMUSCULAR | Status: DC | PRN
  Administered 2015-09-06: 2 mg via INTRAVENOUS

## 2015-09-06 MED ORDER — CEPHALEXIN 500 MG PO CAPS: 500.0000 mg | ORAL_CAPSULE | Freq: Four times a day (QID) | ORAL | 0 refills | Status: DC

## 2015-09-06 MED ORDER — PROMETHAZINE HCL 25 MG/ML IJ SOLN: 6.2500 mg | INTRAMUSCULAR | Status: DC | PRN

## 2015-09-06 MED ORDER — CEFAZOLIN SODIUM-DEXTROSE 2-3 GM-% IV SOLR
INTRAVENOUS | Status: DC | PRN
  Administered 2015-09-06: 2 g via INTRAVENOUS

## 2015-09-06 MED ORDER — MIDAZOLAM HCL 2 MG/2ML IJ SOLN
0.5000 mg | Freq: Once | INTRAMUSCULAR | Status: DC | PRN
Start: 2015-09-06 — End: 2015-09-07

## 2015-09-06 MED ORDER — PROPOFOL 10 MG/ML IV BOLUS
INTRAVENOUS | Status: DC | PRN
  Administered 2015-09-06: 150 mg via INTRAVENOUS
  Administered 2015-09-06 (×2): 50 mg via INTRAVENOUS

## 2015-09-06 MED ORDER — LIDOCAINE HCL (CARDIAC) 20 MG/ML IV SOLN
INTRAVENOUS | Status: AC
  Filled 2015-09-06: qty 5

## 2015-09-06 MED ORDER — KETOROLAC TROMETHAMINE 30 MG/ML IJ SOLN
INTRAMUSCULAR | Status: AC
  Filled 2015-09-06: qty 1

## 2015-09-06 MED ORDER — DEXAMETHASONE SODIUM PHOSPHATE 4 MG/ML IJ SOLN
INTRAMUSCULAR | Status: AC
  Filled 2015-09-06: qty 1

## 2015-09-06 MED ORDER — BUPIVACAINE HCL (PF) 0.5 % IJ SOLN
INTRAMUSCULAR | Status: DC | PRN
  Administered 2015-09-06: 15 mL

## 2015-09-06 MED ORDER — PROPOFOL 10 MG/ML IV BOLUS
INTRAVENOUS | Status: AC
  Filled 2015-09-06: qty 20

## 2015-09-06 MED ORDER — BUPIVACAINE HCL (PF) 0.5 % IJ SOLN
INTRAMUSCULAR | Status: AC
  Filled 2015-09-06: qty 30

## 2015-09-06 MED ORDER — FENTANYL CITRATE (PF) 100 MCG/2ML IJ SOLN: 25.0000 ug | INTRAMUSCULAR | Status: DC | PRN

## 2015-09-06 MED ORDER — SODIUM CHLORIDE 0.9 % IV SOLN
INTRAVENOUS | Status: DC | PRN
  Administered 2015-09-06: 21:00:00 via INTRAVENOUS

## 2015-09-06 MED ORDER — PHENYLEPHRINE 40 MCG/ML (10ML) SYRINGE FOR IV PUSH (FOR BLOOD PRESSURE SUPPORT)
PREFILLED_SYRINGE | INTRAVENOUS | Status: AC
  Filled 2015-09-06: qty 10

## 2015-09-06 MED ORDER — PHENYLEPHRINE HCL 10 MG/ML IJ SOLN
INTRAMUSCULAR | Status: DC | PRN
  Administered 2015-09-06 (×4): 40 ug via INTRAVENOUS

## 2015-09-06 MED ORDER — EPHEDRINE SULFATE 50 MG/ML IJ SOLN
INTRAMUSCULAR | Status: DC | PRN
  Administered 2015-09-06: 10 mg via INTRAVENOUS

## 2015-09-06 MED ORDER — TRAMADOL HCL 50 MG PO TABS: 50.0000 mg | ORAL_TABLET | Freq: Four times a day (QID) | ORAL | 0 refills | Status: DC | PRN

## 2015-09-06 MED ORDER — ONDANSETRON HCL 4 MG/2ML IJ SOLN
INTRAMUSCULAR | Status: AC
  Filled 2015-09-06: qty 2

## 2015-09-06 MED ORDER — DEXAMETHASONE SODIUM PHOSPHATE 10 MG/ML IJ SOLN
INTRAMUSCULAR | Status: DC | PRN
  Administered 2015-09-06: 4 mg via INTRAVENOUS

## 2015-09-06 MED ORDER — ONDANSETRON HCL 4 MG/2ML IJ SOLN
INTRAMUSCULAR | Status: DC | PRN
Start: 2015-09-06 — End: 2015-09-06
  Administered 2015-09-06: 4 mg via INTRAVENOUS

## 2015-09-06 MED ORDER — SODIUM CHLORIDE 0.9 % IV SOLN
Freq: Once | INTRAVENOUS | Status: AC
  Administered 2015-09-06: 21:00:00 via INTRAVENOUS

## 2015-09-06 MED ORDER — MEPERIDINE HCL 25 MG/ML IJ SOLN
6.2500 mg | INTRAMUSCULAR | Status: DC | PRN
Start: 2015-09-06 — End: 2015-09-07

## 2015-09-06 MED ORDER — LACTATED RINGERS IV SOLN
INTRAVENOUS | Status: DC | PRN
  Administered 2015-09-06: 22:00:00 via INTRAVENOUS

## 2015-09-06 MED ORDER — FENTANYL CITRATE (PF) 250 MCG/5ML IJ SOLN
INTRAMUSCULAR | Status: AC
  Filled 2015-09-06: qty 5

## 2015-09-06 MED ORDER — FENTANYL CITRATE (PF) 100 MCG/2ML IJ SOLN
INTRAMUSCULAR | Status: DC | PRN
  Administered 2015-09-06: 50 ug via INTRAVENOUS
  Administered 2015-09-06: 100 ug via INTRAVENOUS

## 2015-09-06 MED ORDER — MIDAZOLAM HCL 2 MG/2ML IJ SOLN
INTRAMUSCULAR | Status: AC
  Filled 2015-09-06: qty 2

## 2015-09-06 SURGICAL SUPPLY — 8 items
CLOTH BEACON ORANGE TIMEOUT ST (SAFETY) ×2 IMPLANT
COUNTER NEEDLE 1200 MAGNETIC (NEEDLE) ×2 IMPLANT
GOWN SURG XXL (GOWNS) ×2 IMPLANT
GOWN SURGICAL LARGE (GOWNS) ×2 IMPLANT
NS IRRIG 1000ML POUR BTL (IV SOLUTION) ×2 IMPLANT
PACK VAGINAL MINOR WOMEN LF (CUSTOM PROCEDURE TRAY) ×2 IMPLANT
SET ASEPTIC TRANSFER (MISCELLANEOUS) ×2 IMPLANT
TOWEL OR 17X24 6PK STRL BLUE (TOWEL DISPOSABLE) ×4 IMPLANT

## 2015-09-06 NOTE — Progress Notes (Signed)
   Subjective:    Patient ID: Catherine Madden, female    DOB: 02-01-1955, 60 y.o.   MRN: [id number]  HPI PT presents to the office today for a Workers [employer] injury for [employer] that occurred today 09/06/15. PT states she was sweeping and fell backwards onto a dustpan. The dustpan has a long handle that was sticking up and patient states this went into her vagina. PT states she is having a 6 out 10 pain. PT reports mild bleeding from her vagina. PT states her lower back and butt are hurting where she fell.     Review of Systems  Genitourinary: Positive for dysuria, vaginal bleeding and vaginal pain.  Musculoskeletal: Positive for back pain.       Objective:   Physical Exam  Constitutional: She is oriented to person, place, and time. She appears well-developed and well-nourished. No distress.  Cardiovascular: Normal rate, regular rhythm, normal heart sounds and intact distal pulses.   No murmur heard. Pulmonary/Chest: Effort normal and breath sounds normal. No respiratory distress. She has no wheezes.  Abdominal: Soft. Bowel sounds are normal. She exhibits no distension. There is no tenderness.  Genitourinary: There are signs of injury in the vagina.  Genitourinary Comments: Vaginal bleeding present, when inserted speculum large amount of pooling blood. I can not see any laceration or injury. Large clot present. Bruising present on inner buttock. No outer lacerations noted.    Musculoskeletal: Normal range of motion. She exhibits no edema or tenderness.  Neurological: She is alert and oriented to person, place, and time. No cranial nerve deficit.  Skin: Skin is warm and dry.  Psychiatric: She has a normal mood and affect. Her behavior is normal. Judgment and thought content normal.  Vitals reviewed.     BP 137/89   Pulse 74   Temp 97.4 F (36.3 C) (Oral)   Ht 5' (1.524 m)   Wt 184 lb 9.6 oz (83.7 kg)   BMI 36.05 kg/m      Assessment & Plan:  1. Vaginal injury, initial  encounter -Due to large amount of blood internally will send to ED Pt to go straight to ED, VS stable at this time. RTO prn   Jannifer Rodneyhristy Hawks, FNP

## 2015-09-06 NOTE — MAU Note (Signed)
[facility] Mop handle to vaginal wall. Pt has a small amount of dried blood noted to peri area at this time. No active bleeding observed. Reports small to moderate pain at this time.

## 2015-09-06 NOTE — Anesthesia Preprocedure Evaluation (Signed)
Anesthesia Evaluation  Patient identified by MRN, date of birth, ID band Patient awake    Reviewed: Allergy & Precautions, NPO status , Patient's Chart, lab work & pertinent test results  History of Anesthesia Complications Negative for: history of anesthetic complications  Airway Mallampati: II  TM Distance: >3 FB Neck ROM: Full    Dental  (+) Edentulous Upper, Edentulous Lower   Pulmonary COPD, former smoker (quit 2007),    breath sounds clear to auscultation       Cardiovascular hypertension, Pt. on medications (-) angina Rhythm:Regular Rate:Normal     Neuro/Psych Anxiety Depression    GI/Hepatic Neg liver ROS, GERD  Medicated and Controlled,  Endo/Other  Hypothyroidism   Renal/GU negative Renal ROS     Musculoskeletal   Abdominal   Peds  Hematology   Anesthesia Other Findings   Reproductive/Obstetrics                             Anesthesia Physical Anesthesia Plan  ASA: II  Anesthesia Plan: General   Post-op Pain Management:    Induction: Intravenous  Airway Management Planned: LMA  Additional Equipment:   Intra-op Plan:   Post-operative Plan:   Informed Consent: I have reviewed the patients History and Physical, chart, labs and discussed the procedure including the risks, benefits and alternatives for the proposed anesthesia with the patient or authorized representative who has indicated his/her understanding and acceptance.     Plan Discussed with: CRNA and Surgeon  Anesthesia Plan Comments: (Plan routine monitors, GA- LMA OK)        Anesthesia Quick Evaluation

## 2015-09-06 NOTE — H&P (Signed)
Catherine BurrowJohn V Kyion Gautier, MD  Obstetrics/Gynecology  Expand All Collapse All   [] Hide copied text [] Hover for attribution information  History     CSN: [id number]  Arrival date and time: 09/06/15 1406   None        Chief Complaint  Patient presents with  . Vaginal Bleeding   HPI Traumatic vaginal laceration at work at [employer] this morning when she fell bacwards and landed on a short vertical rounded tip handle to industrial dustpan, [facility], [facility] after laceration of introitus noted and considered too severe for ED [facility].   Pertinent Gynecological History: Menses: post-menopausal and s/p hyst Bleeding:  Contraception: post-menopausal and s/p hyst DES exposure:  Blood transfusions:  Sexually transmitted diseases:  Previous GYN Procedures: abd hyst  Last mammogram:  Date:  Last pap:not required.  Date:        Past Medical History:  Diagnosis Date  . Anxiety   . Borderline diabetes   . Depression   . GERD (gastroesophageal reflux disease)   . High cholesterol   . Hypertension   . Thyroid disease          Past Surgical History:  Procedure Laterality Date  . ABDOMINAL HYSTERECTOMY    . CESAREAN SECTION    . CHOLECYSTECTOMY    . TUBAL LIGATION           Family History  Problem Relation Age of Onset  . Diabetes Father          Social History  Substance Use Topics  . Smoking status: Former Smoker    Packs/day: 1.00    Years: 20.00    Types: Cigarettes    Quit date: 01/19/2005  . Smokeless tobacco: Never Used  . Alcohol use No    Allergies: No Known Allergies         Prescriptions Prior to Admission  Medication Sig Dispense Refill Last Dose  . ALPRAZolam (XANAX) 1 MG tablet Take 1 mg by mouth 2 (two) times daily as needed for anxiety or sleep.   0 09/05/2015 at Unknown time  . citalopram (CELEXA) 20 MG tablet Take 20 mg by mouth daily.   0 09/06/2015 at Unknown time  . levothyroxine  (SYNTHROID, LEVOTHROID) 50 MCG tablet Take 50 mcg by mouth daily before breakfast.   09/06/2015 at Unknown time  . lisinopril-hydrochlorothiazide (PRINZIDE,ZESTORETIC) 10-12.5 MG per tablet Take 1 tablet by mouth daily.  0 09/06/2015 at Unknown time  . omeprazole (PRILOSEC) 40 MG capsule Take 40 mg by mouth daily.  2 09/06/2015 at Unknown time  . simvastatin (ZOCOR) 20 MG tablet Take 20 mg by mouth daily.   0 09/06/2015 at Unknown time  . zolpidem (AMBIEN) 10 MG tablet Take 10 mg by mouth at bedtime as needed for sleep.   3 09/05/2015 at Unknown time  . doxycycline (VIBRAMYCIN) 100 MG capsule Take 1 capsule (100 mg total) by mouth 2 (two) times daily. (Patient not taking: Reported on 09/06/2015) 28 capsule 0 Completed Course at Unknown time  . ibuprofen (ADVIL,MOTRIN) 800 MG tablet Take 1 tablet (800 mg total) by mouth 3 (three) times daily. (Patient not taking: Reported on 09/06/2015) 21 tablet 0 Not Taking at Unknown time  . ondansetron (ZOFRAN) 4 MG tablet Take 1 tablet (4 mg total) by mouth every 8 (eight) hours as needed for nausea or vomiting. (Patient not taking: Reported on 06/05/2014) 6 tablet 0 Completed Course at Unknown time    Review of  Systems  Constitutional: Negative for chills and fever.  Gastrointestinal: Negative for abdominal pain, blood in stool, heartburn, nausea and vomiting.   Physical Exam   Blood pressure 135/67, pulse 71, temperature 98.2 F (36.8 C), temperature source Oral, resp. rate 18, height 5\' 6"  (1.676 m), weight 180 lb (81.6 kg), SpO2 99 %.  Physical Exam  Constitutional: She appears well-developed and well-nourished.  HENT:  Head: Normocephalic.  Eyes: Pupils are equal, round, and reactive to light.  Neck: Normal range of motion.  Cardiovascular: Normal rate.   Respiratory: Effort normal.  GI: Soft. Bowel sounds are normal.  Genitourinary:  Genitourinary Comments: Old blood on perineum, no active bleeding, BUS normal, posterior perineum : traumatic  laceration 1 inch deep x 2.5 inches long in midline , similar to median ob laceration might cause, with clearly defined upper margin less than halfway up vagina. Well healed vaginal apex and cuff, no trauma. No suspicion of rectal penetration or involvement. Anal tissues intact without laceration   CBC Labs(Brief)          Component Value Date/Time   WBC 12.7 (H) 06/05/2014 1925   RBC 4.40 06/05/2014 1925   HGB 12.3 06/05/2014 1925   HCT 38.6 06/05/2014 1925   PLT 214 06/05/2014 1925   MCV 87.7 06/05/2014 1925   MCH 28.0 06/05/2014 1925   MCHC 31.9 06/05/2014 1925   RDW 13.3 06/05/2014 1925   LYMPHSABS 0.4 (L) 06/05/2014 1925   MONOABS 0.5 06/05/2014 1925   EOSABS 0.0 06/05/2014 1925   BASOSABS 0.0 06/05/2014 1925       MAU Course  Procedures  MDM Exam  Lab review. Assessment and Plan  Traumatic perineal laceration from fall at work,   In order to insure optimal cleaning and exploration, will take to OR.  Brief surgical procedure expected.  Risks of infection, poor healing, tenderness, scar sensitivity discussed with pt. And she accept recommendation for surgery in OR.  Catherine Madden V 09/06/2015, 8:52 PM

## 2015-09-06 NOTE — Brief Op Note (Signed)
09/06/2015  10:23 PM  PATIENT:  Catherine Madden  60 y.o. female  PRE-OPERATIVE DIAGNOSIS:  vaginal trauma, perineal (vaginal) laceration  POST-OPERATIVE DIAGNOSIS:  vaginal trauma, perineal (vaginal) laceration repaired  PROCEDURE:  Procedure(s): Repait of Vaginal Laceration (N/A)  SURGEON:  Surgeon(s) and Role:    * Tilda BurrowJohn V Perri Lamagna, MD - Primary  PHYSICIAN ASSISTANT:   ASSISTANTS: none   ANESTHESIA:   local and general with LMA  EBL:  Total I/O In: 800 [I.V.:800] Out: 110 [Urine:100; Blood:10]  BLOOD ADMINISTERED:none  DRAINS: none   LOCAL MEDICATIONS USED:  MARCAINE    and Amount: 20 ml  SPECIMEN:  No Specimen  DISPOSITION OF SPECIMEN:  N/A  COUNTS:  YES  TOURNIQUET:  * No tourniquets in log *  DICTATION: .Dragon Dictation  PLAN OF CARE: Discharge to home after PACU  PATIENT DISPOSITION:  PACU - hemodynamically stable.   Delay start of Pharmacological VTE agent (>24hrs) due to surgical blood loss or risk of bleeding: not applicable Details of procedure the patient was taken operating room prepped and draped for vaginal procedure with legs in low lithotomy leg support timeout was conducted, Ancef 2 g administered and confirmed by surgical team. Hernia was prepped with Betadine, then extended laceration confirmed. Digital rectal exam was double gloved index finger revealed that the laceration extended perineal body but did not enter the rectum section confirmed that went through the perineal body but did not involve rectal mucosa extensive irrigation was performed and the entire length of the defect and abrasion done sufficiently to clean out any clots and ensure no debris was present. Thorough irrigation was completed then 2-0 Vicryl ET 1 needle was then used in continuous running fashion. The digital rectal hand was regloved. Prior to this procedure. The room was anchored superiorly, continuous running to the bottom of the defect and then subcuticular closure back up  to the apex to complete repair and give perineal support. Digital speculum and speculum exam was then performed confirming that the rest of the vagina was intact anus was also inspected and confirmed as being intact. The perineum was noted to have bruising over the ischial tuberosities bilaterally but there was no irregularity to the tuberosity inferior margins patient tolerated procedure well and went to recovery in good condition

## 2015-09-06 NOTE — ED Triage Notes (Signed)
Pt was sweeping, tripped and fell back onto a dustpan handle (wooden) which entered her vagina and caused her to bleed.  Per pt, she has used 2 large pads in the last hour.  [facility].  Denies sexual assault.

## 2015-09-06 NOTE — Anesthesia Procedure Notes (Signed)
Procedure Name: LMA Insertion Date/Time: 09/06/2015 9:41 PM Performed by: Marrion CoyMERRITT, Mayling Aber R Pre-anesthesia Checklist: Patient identified, Emergency Drugs available, Suction available and Patient being monitored Patient Re-evaluated:Patient Re-evaluated prior to inductionOxygen Delivery Method: Circle system utilized Preoxygenation: Pre-oxygenation with 100% oxygen Intubation Type: IV induction Ventilation: Mask ventilation without difficulty LMA: LMA inserted LMA Size: 4.0 Number of attempts: 1 Placement Confirmation: positive ETCO2 and breath sounds checked- equal and bilateral Dental Injury: Teeth and Oropharynx as per pre-operative assessment

## 2015-09-06 NOTE — ED Notes (Signed)
Pt transported to xray 

## 2015-09-06 NOTE — Transfer of Care (Signed)
Immediate Anesthesia Transfer of Care Note  Patient: Catherine Madden  Procedure(s) Performed: Procedure(s): Repait of Vaginal Laceration (N/A)  Patient Location: PACU  Anesthesia Type:General  Level of Consciousness: awake, alert , oriented and patient cooperative  Airway & Oxygen Therapy: Patient Spontanous Breathing and Patient connected to nasal cannula oxygen  Post-op Assessment: Report given to RN, Post -op Vital signs reviewed and stable and Patient moving all extremities X 4  Post vital signs: Reviewed and stable  Last Vitals:  Vitals:   09/06/15 1845 09/06/15 1930  BP: 115/70 135/67  Pulse: 74 71  Resp: 16 18  Temp: 36.8 C 36.8 C    Last Pain:  Vitals:   09/06/15 1931  TempSrc:   PainSc: 6          Complications: No apparent anesthesia complications

## 2015-09-06 NOTE — Discharge Instructions (Signed)
The injury that you have almost identically matches an episiotomy, so the episiotomy instructions given as a guideline for follow-up care Episiotomy, Care After Refer to this sheet in the next few weeks. These instructions provide you with information on caring for yourself after your procedure. Your health care provider may also give you more specific instructions. Your treatment has been planned according to current medical practices, but problems sometimes occur. Call your health care provider if you have any problems or questions after your procedure. WHAT TO EXPECT AFTER THE PROCEDURE After your procedure, it is typical to have the following sensations:  Pain or aching around the episiotomy site.  Redness and mild swelling around the episiotomy site.  A tugging or pulling sensation at the episiotomy site from the stitches. HOME CARE INSTRUCTIONS   The first day, put ice on the episiotomy area.  Put ice in a plastic bag.  Place a towel between your skin and the bag.  Leave the ice on for 20 minutes, 2-3 times a day.  Bathe using a warm sitz bath as directed by your health care provider. This can speed up healing. Sitz baths can be performed in your bathtub or using a sitz bath kit that fits over your toilet.  Place 3-4 inches of warm water in your bathtub or fill the sitz bath over-the-toilet container with warm water. Make sure the water is not too hot by placing a drop on your wrist.  Sit in the warm water for 20-30 minutes.  After bathing, pat your perineum dry with a clean towel. Do not scrub the perineum as this could cause pain, irritation, or open any stitches you may have.  Keep the over-the-toilet sitz bath container clean by rinsing it thoroughly after each use. Ask for help in keeping the bathtub clean with diluted bleach and water (2 tablespoons of bleach to one half gallon of water).  Repeat the sitz bath as often as you would like to relieve perineal pain, itching, or  discomfort.  Apply a numbing spray to the episiotomy site as directed by your health care provider. This may help with discomfort.  Wash your hands before and after applying medicine to the episiotomy area.  Put about 3 witch hazel-containing hemorrhoid treatment pads on top of your sanitary pad. The witch hazel in the hemorrhoid pads helps with discomfort and swelling.  Get a peri-bottle to squeeze warm water on your perineum when urinating, spraying the area from front to back. Pat the area to dry.  Sitting on an inflatable ring or pillow may provide comfort.  Only take over-the-counter or prescription medicines for pain, discomfort, or fever as directed by your health care provider.  Do not have sexual intercourse or use tampons until your health care provider says it is okay. Typically, you must wait at least 6 weeks.  Keep all postpartum appointments. SEEK MEDICAL CARE IF:   Your pain is not relieved with medicines.  You have painful urination.  You have a fever. SEEK IMMEDIATE MEDICAL CARE IF:   You have redness, swelling, or increasing pain in the episiotomy area.  You have pus coming from the episiotomy area.  You notice a bad smell coming from the episiotomy area.  Your episiotomy opens.  You notice swelling in the episiotomy area that is larger than when you left the hospital.  You cannot urinate.   This information is not intended to replace advice given to you by your health care provider. Make sure you discuss any  questions you have with your health care provider.   Document Released: 01/05/2005 Document Revised: 01/26/2014 Document Reviewed: 10/11/2012 Elsevier Interactive Patient Education [employer].

## 2015-09-06 NOTE — ED Notes (Signed)
[facility]

## 2015-09-06 NOTE — Anesthesia Postprocedure Evaluation (Signed)
Anesthesia Post Note  Patient: Catherine Madden  Procedure(s) Performed: Procedure(s) (LRB): Repait of Vaginal Laceration (N/A)  Patient location during evaluation: PACU Anesthesia Type: General Level of consciousness: awake and alert, patient cooperative and oriented Pain management: pain level controlled Vital Signs Assessment: post-procedure vital signs reviewed and stable Respiratory status: spontaneous breathing, nonlabored ventilation and respiratory function stable Cardiovascular status: blood pressure returned to baseline and stable Postop Assessment: no signs of nausea or vomiting Anesthetic complications: no     Last Vitals:  Vitals:   09/06/15 2230 09/06/15 2245  BP: 117/87 134/74  Pulse: 87 85  Resp: 13 17  Temp:      Last Pain:  Vitals:   09/06/15 1931  TempSrc:   PainSc: 6    Pain Goal:                 Trenese Haft,E. Tirso Laws

## 2015-09-06 NOTE — ED Notes (Signed)
Dr. Nettie El[facility]ichael Ervin [facility].

## 2015-09-06 NOTE — ED Provider Notes (Signed)
[facility]-EMERGENCY DEPT Provider Note   CSN: [id number] Arrival date & time: 09/06/15  1406     History   Chief Complaint Chief Complaint  Patient presents with  . Vaginal Bleeding    HPI Catherine SIELOFF is a 60 y.o. female.  HPI   60 year old female with history of anxiety and depression presenting for complaints of vaginal bleeding. Patient states today while at work she was sweeping the floor, accidentally tripped on the broom handle, fell back and landed on the ground. She landed on the ground and a dust pan with a wooden handle which was laying on floor, impaled her through her vagina. The handle did not broke off. She report acute onset of sharp pain and has had vaginal bleeding since.  Sts she has used 2 large pads within the past hr.  Incident happened at 10am, unwitnessed. She initially went to her PCP office, who evaluate pt and recommend pt to go to [facility] for further management.  Pt called and contact [facility] but was told to come to [facility] ER for further care.  Currently pain is controlled without any specific treatment.  Denies lightheadedness, dizziness, cp, sob, back pain.  She denies any precipitating sxs prior to the fall.  She is UTD with tetanus.  She is not on any blood thinner medication.  She also denies sexual assault.  Incident happened while at work. Pt is accompany by her boss.    Past Medical History:  Diagnosis Date  . Anxiety   . Borderline diabetes   . Depression   . GERD (gastroesophageal reflux disease)   . High cholesterol   . Hypertension   . Thyroid disease     There are no active problems to display for this patient.   Past Surgical History:  Procedure Laterality Date  . ABDOMINAL HYSTERECTOMY    . CESAREAN SECTION    . CHOLECYSTECTOMY    . TUBAL LIGATION      OB History    Gravida Para Term Preterm AB Living   1 1 1     1    SAB TAB Ectopic Multiple Live Births                   Home Medications    Prior to  Admission medications   Medication Sig Start Date End Date Taking? Authorizing Provider  ALPRAZolam Prudy Feeler) 1 MG tablet Take 1 mg by mouth 2 (two) times daily as needed for anxiety or sleep.  02/01/14  Yes Historical Provider, MD  citalopram (CELEXA) 20 MG tablet Take 20 mg by mouth daily.  02/02/14  Yes Historical Provider, MD  levothyroxine (SYNTHROID, LEVOTHROID) 50 MCG tablet Take 50 mcg by mouth daily before breakfast.   Yes Historical Provider, MD  lisinopril-hydrochlorothiazide (PRINZIDE,ZESTORETIC) 10-12.5 MG per tablet Take 1 tablet by mouth daily. 02/01/14  Yes Historical Provider, MD  omeprazole (PRILOSEC) 40 MG capsule Take 40 mg by mouth daily. 04/02/14  Yes Historical Provider, MD  simvastatin (ZOCOR) 20 MG tablet Take 20 mg by mouth daily.  02/01/14  Yes Historical Provider, MD  zolpidem (AMBIEN) 10 MG tablet Take 10 mg by mouth at bedtime as needed for sleep.  01/27/14  Yes Historical Provider, MD  doxycycline (VIBRAMYCIN) 100 MG capsule Take 1 capsule (100 mg total) by mouth 2 (two) times daily. Patient not taking: Reported on 09/06/2015 06/05/14   Eber Hong, MD  ibuprofen (ADVIL,MOTRIN) 800 MG tablet Take 1 tablet (800 mg total) by mouth 3 (  three) times daily. Patient not taking: Reported on 09/06/2015 06/05/14   Eber HongBrian Miller, MD  ondansetron (ZOFRAN) 4 MG tablet Take 1 tablet (4 mg total) by mouth every 8 (eight) hours as needed for nausea or vomiting. Patient not taking: Reported on 06/05/2014 02/24/14   Samuel JesterKathleen McManus, DO    Family History Family History  Problem Relation Age of Onset  . Diabetes Father     Social History Social History  Substance Use Topics  . Smoking status: Former Smoker    Packs/day: 1.00    Years: 20.00    Types: Cigarettes    Quit date: 01/19/2005  . Smokeless tobacco: Never Used  . Alcohol use No     Allergies   Review of patient's allergies indicates no known allergies.   Review of Systems Review of Systems  All other systems reviewed and  are negative.    Physical Exam Updated Vital Signs BP 143/77   Pulse 83   Temp 98.1 F (36.7 C) (Oral)   Resp 14   Ht 5\' 6"  (1.676 m)   Wt 81.6 kg   SpO2 100%   BMI 29.05 kg/m   Physical Exam  Constitutional: She appears well-developed and well-nourished. No distress.  HENT:  Head: Atraumatic.  Eyes: Conjunctivae are normal.  Neck: Neck supple.  Genitourinary:  Genitourinary Comments: Chaperone present during exam. Faint Ecchymosis noted to perineum involving bilateral buttocks. 4 cm vertical laceration noted to the inferior aspects of the vaginal vault extending from the introitus, actively bleeding without any foreign object noted.  Tenderness to palpation. Evidence of hysterectomy as cervix not present. No rectal involvement.Hips stable.  Neurological: She is alert.  Skin: No rash noted.  Psychiatric: She has a normal mood and affect.  Nursing note and vitals reviewed.    ED Treatments / Results  Labs (all labs ordered are listed, but only abnormal results are displayed) Labs Reviewed - No data to display  EKG  EKG Interpretation None       Radiology No results found.  Procedures Procedures (including critical care time)          Medications Ordered in ED Medications - No data to display   Initial Impression / Assessment and Plan / ED Course  I have reviewed the triage vital signs and the nursing notes.  Pertinent labs & imaging results that were available during my care of the patient were reviewed by me and considered in my medical decision making (see chart for details).  Clinical Course    BP 136/95   Pulse 80   Temp 98.1 F (36.7 C) (Oral)   Resp 16   Ht 5\' 6"  (1.676 m)   Wt 81.6 kg   SpO2 98%   BMI 29.05 kg/m    Final Clinical Impressions(s) / ED Diagnoses   Final diagnoses:  Tear of vaginal muscle, initial encounter    New Prescriptions New Prescriptions   No medications on file   4:51 PM Pt had a mechanical injury  when she fell onto a duspan handle which impaled her vaginal region.  She suffered a 4cm laceration to the inferior aspect of her vaginal vault without rectal involvement.  She is UTD with tetanus.  I offer pain medication but pt declined at this time.  Care discussed with Dr. Deretha EmoryZackowski.  Plan to consult GYN for further management and for vaginal repair.   5:12 PM App[facility] Dr. Nettie ElmMichael Ervin who request pt to be transfer to  MAU for repair of her vaginal tear.  Pt agrees with plan.  Will initiate transfer.  I did order a pelvis Xray to r/o fx or dislocation.     Fayrene HelperBowie Rodriques Badie, PA-C 09/06/15 1716    Vanetta MuldersScott Zackowski, MD 09/06/15 [phone]

## 2015-09-06 NOTE — Op Note (Signed)
Please see brief operative note for that surgical details

## 2015-09-06 NOTE — MAU Provider Note (Signed)
History     CSN: [id number]  Arrival date and time: 09/06/15 1406   None     Chief Complaint  Patient presents with  . Vaginal Bleeding   HPI Traumatic vaginal laceration at work at [employer] this morning when she fell bacwards and landed on a short vertical rounded tip handle to industrial dustpan, [facility], [facility] after laceration of introitus noted and considered too severe for ED repair [facility].   Pertinent Gynecological History: Menses: post-menopausal and s/p hyst Bleeding:  Contraception: post-menopausal and s/p hyst DES exposure:  Blood transfusions:  Sexually transmitted diseases:  Previous GYN Procedures: abd hyst  Last mammogram:  Date:  Last pap:not required.  Date:    Past Medical History:  Diagnosis Date  . Anxiety   . Borderline diabetes   . Depression   . GERD (gastroesophageal reflux disease)   . High cholesterol   . Hypertension   . Thyroid disease     Past Surgical History:  Procedure Laterality Date  . ABDOMINAL HYSTERECTOMY    . CESAREAN SECTION    . CHOLECYSTECTOMY    . TUBAL LIGATION      Family History  Problem Relation Age of Onset  . Diabetes Father     Social History  Substance Use Topics  . Smoking status: Former Smoker    Packs/day: 1.00    Years: 20.00    Types: Cigarettes    Quit date: 01/19/2005  . Smokeless tobacco: Never Used  . Alcohol use No    Allergies: No Known Allergies  Prescriptions Prior to Admission  Medication Sig Dispense Refill Last Dose  . ALPRAZolam (XANAX) 1 MG tablet Take 1 mg by mouth 2 (two) times daily as needed for anxiety or sleep.   0 09/05/2015 at Unknown time  . citalopram (CELEXA) 20 MG tablet Take 20 mg by mouth daily.   0 09/06/2015 at Unknown time  . levothyroxine (SYNTHROID, LEVOTHROID) 50 MCG tablet Take 50 mcg by mouth daily before breakfast.   09/06/2015 at Unknown time  . lisinopril-hydrochlorothiazide (PRINZIDE,ZESTORETIC) 10-12.5 MG per tablet Take 1 tablet by mouth  daily.  0 09/06/2015 at Unknown time  . omeprazole (PRILOSEC) 40 MG capsule Take 40 mg by mouth daily.  2 09/06/2015 at Unknown time  . simvastatin (ZOCOR) 20 MG tablet Take 20 mg by mouth daily.   0 09/06/2015 at Unknown time  . zolpidem (AMBIEN) 10 MG tablet Take 10 mg by mouth at bedtime as needed for sleep.   3 09/05/2015 at Unknown time  . doxycycline (VIBRAMYCIN) 100 MG capsule Take 1 capsule (100 mg total) by mouth 2 (two) times daily. (Patient not taking: Reported on 09/06/2015) 28 capsule 0 Completed Course at Unknown time  . ibuprofen (ADVIL,MOTRIN) 800 MG tablet Take 1 tablet (800 mg total) by mouth 3 (three) times daily. (Patient not taking: Reported on 09/06/2015) 21 tablet 0 Not Taking at Unknown time  . ondansetron (ZOFRAN) 4 MG tablet Take 1 tablet (4 mg total) by mouth every 8 (eight) hours as needed for nausea or vomiting. (Patient not taking: Reported on 06/05/2014) 6 tablet 0 Completed Course at Unknown time    Review of Systems  Constitutional: Negative for chills and fever.  Gastrointestinal: Negative for abdominal pain, blood in stool, heartburn, nausea and vomiting.   Physical Exam   Blood pressure 135/67, pulse 71, temperature 98.2 F (36.8 C), temperature source Oral, resp. rate 18, height 5\' 6"  (1.676 m), weight 180  lb (81.6 kg), SpO2 99 %.  Physical Exam  Constitutional: She appears well-developed and well-nourished.  HENT:  Head: Normocephalic.  Eyes: Pupils are equal, round, and reactive to light.  Neck: Normal range of motion.  Cardiovascular: Normal rate.   Respiratory: Effort normal.  GI: Soft. Bowel sounds are normal.  Genitourinary:  Genitourinary Comments: Old blood on perineum, no active bleeding, BUS normal, posterior perineum : traumatic laceration 1 inch deep x 2.5 inches long in midline , similar to median ob laceration might cause, with clearly defined upper margin less than halfway up vagina. Well healed vaginal apex and cuff, no trauma. No suspicion of  rectal penetration or involvement. Anal tissues intact without laceration   CBC    Component Value Date/Time   WBC 12.7 (H) 06/05/2014 1925   RBC 4.40 06/05/2014 1925   HGB 12.3 06/05/2014 1925   HCT 38.6 06/05/2014 1925   PLT 214 06/05/2014 1925   MCV 87.7 06/05/2014 1925   MCH 28.0 06/05/2014 1925   MCHC 31.9 06/05/2014 1925   RDW 13.3 06/05/2014 1925   LYMPHSABS 0.4 (L) 06/05/2014 1925   MONOABS 0.5 06/05/2014 1925   EOSABS 0.0 06/05/2014 1925   BASOSABS 0.0 06/05/2014 1925     MAU Course  Procedures  MDM Exam  Lab review. Assessment and Plan  Traumatic perineal laceration from fall at work,   In order to insure optimal cleaning and exploration, will take to OR.  Brief surgical procedure expected.  Risks of infection, poor healing, tenderness, scar sensitivity discussed with pt. And she accept recommendation for surgery in OR.  Mahlik Lenn V 09/06/2015, 8:52 PM

## 2015-09-09 ENCOUNTER — Encounter (HOSPITAL_COMMUNITY): Payer: Self-pay | Admitting: Obstetrics and Gynecology

## 2015-09-12 ENCOUNTER — Encounter: Payer: Self-pay | Admitting: Obstetrics and Gynecology

## 2015-09-12 ENCOUNTER — Ambulatory Visit (INDEPENDENT_AMBULATORY_CARE_PROVIDER_SITE_OTHER): Payer: Worker's Compensation | Admitting: Obstetrics and Gynecology

## 2015-09-12 DIAGNOSIS — S3141XA Laceration without foreign body of vagina and vulva, initial encounter: Secondary | ICD-10-CM

## 2015-09-12 DIAGNOSIS — S3140XA Unspecified open wound of vagina and vulva, initial encounter: Secondary | ICD-10-CM | POA: Diagnosis not present

## 2015-09-12 NOTE — Progress Notes (Signed)
   Subjective:  Catherine Madden is a 60 y.o. female now 1 weeks status post Repair  of traumatic vaginal laceration.    Repair performed [facility]. 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  Pain is controlled without any medications.  Objective:  BP 110/70 (BP Location: Left Arm, Patient Position: Sitting, Cuff Size: Normal)   Pulse 82   Ht 5\' 6"  (1.676 m)   Wt 186 lb (84.4 kg)   BMI 30.02 kg/m  General:Well developed, well nourished.  No acute distress. Abdomen: Bowel sounds normal, soft, non-tender. Pelvic Exam:    External Genitalia:  Normal.Tissue edges well approximated no swelling. The bruising around the ischial tuberosities has almost gone    Vagina: Normal    Cervix: Normal    Uterus: Normal    Adnexa/Bimanual: Normal  Incision(s):   Healing Well, no drainage, no erythema, no hernia, no swelling, no dehiscence,  Minimal separation of the superficial skin edges, healing appropriately. Willl be fully recovered in the usual 4 weeks   Assessment:  Post-Op 1 weeks s/p Repair of traumatic vaginal laceration   May return to work 28 August  Doing may return to work next Monday postoperatively.   Plan:  1.Wound care discussed   2. . current medications.none3. Activity restrictions: Return to work Monday 4. return to work: 1-2 weeks. 5. Follow up in 4 week.

## 2015-10-14 ENCOUNTER — Ambulatory Visit (INDEPENDENT_AMBULATORY_CARE_PROVIDER_SITE_OTHER): Payer: Worker's Compensation | Admitting: Obstetrics and Gynecology

## 2015-10-14 ENCOUNTER — Encounter: Payer: Self-pay | Admitting: Obstetrics and Gynecology

## 2015-10-14 VITALS — BP 102/68 | HR 62

## 2015-10-14 DIAGNOSIS — Z09 Encounter for follow-up examination after completed treatment for conditions other than malignant neoplasm: Secondary | ICD-10-CM | POA: Diagnosis not present

## 2015-10-14 DIAGNOSIS — S3140XD Unspecified open wound of vagina and vulva, subsequent encounter: Secondary | ICD-10-CM

## 2015-10-14 NOTE — Progress Notes (Signed)
  Subjective:  Debbe BalesCheryl W Bebeau is a 60 y.o. female now 5 weeks status post traumatic vaginal laceration repair From FALL AT WORK.   Review of Systems Negative    Diet:   normal   Bowel movements : normal.  The patient is not having any pain. not yet sexually active  Objective:  BP 102/68 (BP Location: Right Arm, Patient Position: Sitting, Cuff Size: Normal)   Pulse 62  General:Well developed, well nourished.  No acute distress. Abdomen: Bowel sounds normal, soft, non-tender. Pelvic Exam:    External Genitalia:  Normal. Introitus well healed. Stitch remnant at introitus present but was easily able to be dislodged.     Vagina: Normal Rectal Exam: normal, good posterior support, no rectocele    Incision(s):   Healing well, no drainage, no erythema, no hernia, no swelling, no dehiscence,   Assessment:  Post-Op 5 weeks s/p traumatic vaginal laceration repair     Doing well postoperatively.   Plan:  1.Wound care discussed   2. . current medications 3. Activity restrictions: none 4. return to work: now. Already at work 5. Follow up PRN   By signing my name below, I, Sonum Patel, attest that this documentation has been prepared under the direction and in the presence of Tilda BurrowJohn V Marlon Suleiman, MD. Electronically Signed: Sonum Patel, Neurosurgeoncribe. 10/14/15. 3:14 PM.  I personally performed the services described in this documentation, which was SCRIBED in my presence. The recorded information has been reviewed and considered accurate. It has been edited as necessary during review. Tilda BurrowFERGUSON,Dilyn Smiles V, MD

## 2015-10-16 DIAGNOSIS — E782 Mixed hyperlipidemia: Secondary | ICD-10-CM | POA: Diagnosis not present

## 2015-10-16 DIAGNOSIS — I10 Essential (primary) hypertension: Secondary | ICD-10-CM | POA: Diagnosis not present

## 2015-10-16 DIAGNOSIS — E039 Hypothyroidism, unspecified: Secondary | ICD-10-CM | POA: Diagnosis not present

## 2015-10-30 DIAGNOSIS — Z23 Encounter for immunization: Secondary | ICD-10-CM | POA: Diagnosis not present

## 2015-12-11 DIAGNOSIS — E039 Hypothyroidism, unspecified: Secondary | ICD-10-CM | POA: Diagnosis not present

## 2015-12-31 DIAGNOSIS — I10 Essential (primary) hypertension: Secondary | ICD-10-CM | POA: Diagnosis not present

## 2015-12-31 DIAGNOSIS — E039 Hypothyroidism, unspecified: Secondary | ICD-10-CM | POA: Diagnosis not present

## 2015-12-31 DIAGNOSIS — E782 Mixed hyperlipidemia: Secondary | ICD-10-CM | POA: Diagnosis not present

## 2016-01-09 DIAGNOSIS — F329 Major depressive disorder, single episode, unspecified: Secondary | ICD-10-CM | POA: Diagnosis not present

## 2016-01-09 DIAGNOSIS — E782 Mixed hyperlipidemia: Secondary | ICD-10-CM | POA: Diagnosis not present

## 2016-01-09 DIAGNOSIS — R739 Hyperglycemia, unspecified: Secondary | ICD-10-CM | POA: Diagnosis not present

## 2016-01-09 DIAGNOSIS — F419 Anxiety disorder, unspecified: Secondary | ICD-10-CM | POA: Diagnosis not present

## 2016-01-09 DIAGNOSIS — E039 Hypothyroidism, unspecified: Secondary | ICD-10-CM | POA: Diagnosis not present

## 2016-01-30 DIAGNOSIS — Z008 Encounter for other general examination: Secondary | ICD-10-CM | POA: Diagnosis not present

## 2016-01-30 DIAGNOSIS — E785 Hyperlipidemia, unspecified: Secondary | ICD-10-CM | POA: Diagnosis not present

## 2016-01-30 DIAGNOSIS — I10 Essential (primary) hypertension: Secondary | ICD-10-CM | POA: Diagnosis not present

## 2016-01-30 DIAGNOSIS — E039 Hypothyroidism, unspecified: Secondary | ICD-10-CM | POA: Diagnosis not present

## 2016-04-20 DIAGNOSIS — Z1231 Encounter for screening mammogram for malignant neoplasm of breast: Secondary | ICD-10-CM | POA: Diagnosis not present

## 2016-05-27 DIAGNOSIS — M722 Plantar fascial fibromatosis: Secondary | ICD-10-CM | POA: Diagnosis not present

## 2016-05-27 DIAGNOSIS — M25579 Pain in unspecified ankle and joints of unspecified foot: Secondary | ICD-10-CM | POA: Diagnosis not present

## 2016-05-27 DIAGNOSIS — M79671 Pain in right foot: Secondary | ICD-10-CM | POA: Diagnosis not present

## 2016-06-05 DIAGNOSIS — F329 Major depressive disorder, single episode, unspecified: Secondary | ICD-10-CM | POA: Diagnosis not present

## 2016-06-05 DIAGNOSIS — E039 Hypothyroidism, unspecified: Secondary | ICD-10-CM | POA: Diagnosis not present

## 2016-06-05 DIAGNOSIS — R739 Hyperglycemia, unspecified: Secondary | ICD-10-CM | POA: Diagnosis not present

## 2016-06-05 DIAGNOSIS — E876 Hypokalemia: Secondary | ICD-10-CM | POA: Diagnosis not present

## 2016-06-05 DIAGNOSIS — E782 Mixed hyperlipidemia: Secondary | ICD-10-CM | POA: Diagnosis not present

## 2016-06-05 DIAGNOSIS — I10 Essential (primary) hypertension: Secondary | ICD-10-CM | POA: Diagnosis not present

## 2016-06-05 LAB — LIPID PANEL
CHOLESTEROL: 189 (ref 0–200)
HDL: 52 (ref 35–70)
LDL CALC: 103
TRIGLYCERIDES: 168 — AB (ref 40–160)

## 2016-06-05 LAB — TSH: TSH: 11.24 — AB (ref 0.41–5.90)

## 2016-06-10 DIAGNOSIS — E039 Hypothyroidism, unspecified: Secondary | ICD-10-CM | POA: Diagnosis not present

## 2016-06-10 DIAGNOSIS — E782 Mixed hyperlipidemia: Secondary | ICD-10-CM | POA: Diagnosis not present

## 2016-06-10 DIAGNOSIS — Z1389 Encounter for screening for other disorder: Secondary | ICD-10-CM | POA: Diagnosis not present

## 2016-06-10 DIAGNOSIS — G4700 Insomnia, unspecified: Secondary | ICD-10-CM | POA: Diagnosis not present

## 2016-06-10 DIAGNOSIS — I10 Essential (primary) hypertension: Secondary | ICD-10-CM | POA: Diagnosis not present

## 2016-06-16 DIAGNOSIS — M722 Plantar fascial fibromatosis: Secondary | ICD-10-CM | POA: Diagnosis not present

## 2016-06-16 DIAGNOSIS — M25579 Pain in unspecified ankle and joints of unspecified foot: Secondary | ICD-10-CM | POA: Diagnosis not present

## 2016-06-16 DIAGNOSIS — M79671 Pain in right foot: Secondary | ICD-10-CM | POA: Diagnosis not present

## 2016-07-03 ENCOUNTER — Encounter: Payer: Self-pay | Admitting: "Endocrinology

## 2016-07-03 ENCOUNTER — Ambulatory Visit (INDEPENDENT_AMBULATORY_CARE_PROVIDER_SITE_OTHER): Payer: BLUE CROSS/BLUE SHIELD | Admitting: "Endocrinology

## 2016-07-03 VITALS — BP 126/78 | HR 70 | Ht 66.0 in | Wt 187.0 lb

## 2016-07-03 DIAGNOSIS — E039 Hypothyroidism, unspecified: Secondary | ICD-10-CM

## 2016-07-03 MED ORDER — LEVOTHYROXINE SODIUM 100 MCG PO TABS: 100.0000 ug | ORAL_TABLET | Freq: Every day | ORAL | 3 refills | Status: DC

## 2016-07-03 NOTE — Progress Notes (Signed)
Subjective:    Patient ID: Catherine Madden, Catherine Madden    DOB: 08-28-1955, PCP Macky Lower   Past Medical History:  Diagnosis Date  . Anxiety   . Borderline diabetes   . Depression   . GERD (gastroesophageal reflux disease)   . High cholesterol   . Hypertension   . Thyroid disease    Past Surgical History:  Procedure Laterality Date  . ABDOMINAL HYSTERECTOMY    . CESAREAN SECTION    . CHOLECYSTECTOMY    . PERINEAL LACERATION REPAIR N/A 09/06/2015   Procedure: Repair of Vaginal Laceration;  Surgeon: Tilda Burrow, MD;  Location: [facility];  Service: Gynecology;  Laterality: N/A;  . TUBAL LIGATION     Social History   Social History  . Marital status: Married    Spouse name: N/A  . Number of children: N/A  . Years of education: N/A   Social History Main Topics  . Smoking status: Former Smoker    Packs/day: 1.00    Years: 20.00    Types: Cigarettes    Quit date: 01/19/2005  . Smokeless tobacco: Never Used     Comment: quit in 2007  . Alcohol use No  . Drug use: No  . Sexual activity: Not Currently    Birth control/ protection: Surgical     Comment: hyst   Other Topics Concern  . None   Social History Narrative  . None   Outpatient Encounter Prescriptions as of 07/03/2016  Medication Sig  . levothyroxine (SYNTHROID, LEVOTHROID) 100 MCG tablet Take 1 tablet (100 mcg total) by mouth daily before breakfast.  . lisinopril-hydrochlorothiazide (PRINZIDE,ZESTORETIC) 10-12.5 MG per tablet Take 1 tablet by mouth daily.  Marland Kitchen omeprazole (PRILOSEC) 40 MG capsule Take 40 mg by mouth daily.  Marland Kitchen omeprazole (PRILOSEC) 40 MG capsule Take 40 mg by mouth daily.  . simvastatin (ZOCOR) 20 MG tablet Take 20 mg by mouth daily.   Marland Kitchen zolpidem (AMBIEN) 10 MG tablet Take 10 mg by mouth at bedtime as needed for sleep.   . [DISCONTINUED] levothyroxine (SYNTHROID, LEVOTHROID) 88 MCG tablet Take 88 mcg by mouth daily before breakfast.  . ALPRAZolam (XANAX) 1 MG tablet Take 1 mg by mouth  2 (two) times daily as needed for anxiety or sleep.   . citalopram (CELEXA) 20 MG tablet Take 20 mg by mouth daily.   . [DISCONTINUED] cephALEXin (KEFLEX) 500 MG capsule Take 1 capsule (500 mg total) by mouth 4 (four) times daily. (Patient not taking: Reported on 10/14/2015)  . [DISCONTINUED] levothyroxine (SYNTHROID, LEVOTHROID) 50 MCG tablet Take 50 mcg by mouth daily before breakfast.  . [DISCONTINUED] traMADol (ULTRAM) 50 MG tablet Take 1-2 tablets (50-100 mg total) by mouth every 6 (six) hours as needed for moderate pain or severe pain. (Patient not taking: Reported on 10/14/2015)   No facility-administered encounter medications on file as of 07/03/2016.    ALLERGIES: No Known Allergies  VACCINATION STATUS:  There is no immunization history on file for this patient.  HPI 61 year old Catherine Madden patient with medical history as above. She is being seen in consultation for hypothyroidism requested by her PCP  Roma Kayser, PA-C. - She was diagnosed with hypothyroidism approximately at age 71 since when she was treated with various doses of levothyroxine , currently on alternate dose of 75 and 88 g.  She reports compliance to his medication. - Her most recent labs from 06/05/2016 showed TSH of 11.24, has been fluctuating from as low a 0.17- 11.24 over the  last year, there is no associated free T4 nor free T3 with that. - She reports progressive weight gain of 7 pounds over the last 6 months. She denies heat intolerance, palpitations, nor tremors. - She denies dysphagia, shortness of breath, nor voice change. She denies family history of thyroid cancer, however has family history of hypothyroidism in her mother. - She gives a history of prediabetes,  recent A1c.  Review of Systems  Constitutional: +weight gain, no fatigue, no subjective hyperthermia, no subjective hypothermia Eyes: no blurry vision, no xerophthalmia ENT: no sore throat, no nodules palpated in throat, no dysphagia/odynophagia,  no hoarseness Cardiovascular: no Chest Pain, no Shortness of Breath, no palpitations, no leg swelling Respiratory: no cough, no SOB Gastrointestinal: no Nausea/Vomiting/Diarhhea Musculoskeletal: no muscle/joint aches Skin: no rashes Neurological: no tremors, no numbness, no tingling, no dizziness Psychiatric: no depression, no anxiety  Objective:    BP 126/78   Pulse 70   Ht 5\' 6"  (1.676 m)   Wt 187 lb (84.8 kg)   BMI 30.18 kg/m   Wt Readings from Last 3 Encounters:  07/03/16 187 lb (84.8 kg)  09/12/15 186 lb (84.4 kg)  09/06/15 180 lb (81.6 kg)    Physical Exam  Constitutional: Over weight for height, not in acute distress, normal state of mind Eyes: PERRLA, EOMI, no exophthalmos ENT: moist mucous membranes, no thyromegaly, no cervical lymphadenopathy Cardiovascular: normal precordial activity, Regular Rate and Rhythm, no Murmur/Rubs/Gallops Respiratory:  adequate breathing efforts, no gross chest deformity, Clear to auscultation bilaterally Gastrointestinal: abdomen soft, Non -tender, No distension, Bowel Sounds present Musculoskeletal: no gross deformities, strength intact in all four extremities Skin: moist, warm, no rashes Neurological: no tremor with outstretched hands, Deep tendon reflexes normal in all four extremities.  CMP ( most recent) CMP     Component Value Date/Time   NA 134 (L) 06/05/2014 1925   K 3.5 06/05/2014 1925   CL 98 (L) 06/05/2014 1925   CO2 27 06/05/2014 1925   GLUCOSE 133 (H) 06/05/2014 1925   BUN 15 06/05/2014 1925   CREATININE 1.05 (H) 06/05/2014 1925   CALCIUM 9.1 06/05/2014 1925   PROT 7.3 06/05/2014 1925   ALBUMIN 4.2 06/05/2014 1925   AST 60 (H) 06/05/2014 1925   ALT 55 (H) 06/05/2014 1925   ALKPHOS 105 06/05/2014 1925   BILITOT 0.7 06/05/2014 1925   GFRNONAA 58 (L) 06/05/2014 1925   GFRAA >60 06/05/2014 1925    Lipid Panel     Component Value Date/Time   CHOL 189 06/05/2016   TRIG 168 (A) 06/05/2016   HDL 52 06/05/2016    LDLCALC 103 06/05/2016      Assessment & Plan:   1. Hypothyroidism, unspecified type - Patient is being seen at the kind request of her PCP Roma Kayser, PA-C. - I have reviewed her available records showing fluctuating TSH levels, most recent high at 11.24. - She seems to have a well settled diagnosis of primary hypothyroidism, for at least 15 years, likely heading to require  100% replacement. - She would benefit from slight increase in her levothyroxine dose.  - I will prescribe levothyroxine 100 g by mouth every morning, advised her to take it daily.  - We discussed about correct intake of levothyroxine, at fasting, with water, separated by at least 30 minutes from breakfast, and separated by more than 4 hours from calcium, iron, multivitamins, acid reflux medications (PPIs). -Patient is made aware of the fact that thyroid hormone replacement is needed for life, dose to  be adjusted by periodic monitoring of thyroid function tests. - Her next labs will include TSH/free T4, hemoglobin A1c, thyroglobulin antibodies, and thyroid peroxidase antibodies.  - I advised patient to maintain close follow up with Roma KayserSkillman, Katie, PA-C for primary care needs. Follow up plan: Return in about 3 months (around 10/03/2016) for follow up with pre-visit labs.  Marquis LunchGebre Nida, MD Phone: [phone]  Fax: [phone]   07/03/2016, 9:58 AM

## 2016-08-17 ENCOUNTER — Other Ambulatory Visit: Payer: Self-pay | Admitting: "Endocrinology

## 2016-08-17 ENCOUNTER — Telehealth: Payer: Self-pay | Admitting: "Endocrinology

## 2016-08-17 MED ORDER — LEVOTHYROXINE SODIUM 88 MCG PO TABS: 88.0000 ug | ORAL_TABLET | Freq: Every day | ORAL | 3 refills | Status: DC

## 2016-08-17 NOTE — Telephone Encounter (Signed)
Catherine MaxwellCheryl is calling stating that she is having bad Hot Flashes since the Thyroid Med was increased she states that her body feels like its on fire, shes wondering if these symptoms are coming from the increase in medication, please advise?

## 2016-08-17 NOTE — Telephone Encounter (Signed)
We can reverse back to her original dose of 88 g for her to observe. I will send a prescription for lower dose.

## 2016-08-24 NOTE — Telephone Encounter (Signed)
Patient was advised of the recommendation

## 2016-08-27 DIAGNOSIS — Z719 Counseling, unspecified: Secondary | ICD-10-CM | POA: Diagnosis not present

## 2016-08-27 DIAGNOSIS — Z008 Encounter for other general examination: Secondary | ICD-10-CM | POA: Diagnosis not present

## 2016-08-27 DIAGNOSIS — E039 Hypothyroidism, unspecified: Secondary | ICD-10-CM | POA: Diagnosis not present

## 2016-08-27 DIAGNOSIS — I10 Essential (primary) hypertension: Secondary | ICD-10-CM | POA: Diagnosis not present

## 2016-08-27 DIAGNOSIS — E785 Hyperlipidemia, unspecified: Secondary | ICD-10-CM | POA: Diagnosis not present

## 2016-09-22 DIAGNOSIS — E782 Mixed hyperlipidemia: Secondary | ICD-10-CM | POA: Diagnosis not present

## 2016-09-22 DIAGNOSIS — E876 Hypokalemia: Secondary | ICD-10-CM | POA: Diagnosis not present

## 2016-09-22 DIAGNOSIS — E039 Hypothyroidism, unspecified: Secondary | ICD-10-CM | POA: Diagnosis not present

## 2016-09-22 DIAGNOSIS — R739 Hyperglycemia, unspecified: Secondary | ICD-10-CM | POA: Diagnosis not present

## 2016-09-22 DIAGNOSIS — F419 Anxiety disorder, unspecified: Secondary | ICD-10-CM | POA: Diagnosis not present

## 2016-09-22 DIAGNOSIS — F329 Major depressive disorder, single episode, unspecified: Secondary | ICD-10-CM | POA: Diagnosis not present

## 2016-09-22 DIAGNOSIS — I10 Essential (primary) hypertension: Secondary | ICD-10-CM | POA: Diagnosis not present

## 2016-09-22 LAB — LIPID PANEL
CHOLESTEROL: 124 (ref 0–200)
HDL: 46 (ref 35–70)
LDL CALC: 64
TRIGLYCERIDES: 69 (ref 40–160)

## 2016-09-22 LAB — HEMOGLOBIN A1C: HEMOGLOBIN A1C: 5.9

## 2016-09-22 LAB — TSH: TSH: 0.12 — AB (ref <=5.90)

## 2016-10-05 ENCOUNTER — Ambulatory Visit (INDEPENDENT_AMBULATORY_CARE_PROVIDER_SITE_OTHER): Payer: BLUE CROSS/BLUE SHIELD | Admitting: "Endocrinology

## 2016-10-05 ENCOUNTER — Encounter: Payer: Self-pay | Admitting: "Endocrinology

## 2016-10-05 VITALS — BP 140/83 | HR 82 | Ht 66.0 in | Wt 185.0 lb

## 2016-10-05 DIAGNOSIS — R7303 Prediabetes: Secondary | ICD-10-CM | POA: Insufficient documentation

## 2016-10-05 DIAGNOSIS — E039 Hypothyroidism, unspecified: Secondary | ICD-10-CM | POA: Diagnosis not present

## 2016-10-05 DIAGNOSIS — E063 Autoimmune thyroiditis: Secondary | ICD-10-CM | POA: Diagnosis not present

## 2016-10-05 NOTE — Progress Notes (Signed)
Subjective:    Patient ID: Catherine Madden, female    DOB: 1955/03/01, PCP Macky Lower   Past Medical History:  Diagnosis Date  . Anxiety   . Borderline diabetes   . Depression   . GERD (gastroesophageal reflux disease)   . High cholesterol   . Hypertension   . Thyroid disease    Past Surgical History:  Procedure Laterality Date  . ABDOMINAL HYSTERECTOMY    . CESAREAN SECTION    . CHOLECYSTECTOMY    . PERINEAL LACERATION REPAIR N/A 09/06/2015   Procedure: Repair of Vaginal Laceration;  Surgeon: Tilda Burrow, MD;  Location: [facility];  Service: Gynecology;  Laterality: N/A;  . TUBAL LIGATION     Social History   Social History  . Marital status: Married    Spouse name: N/A  . Number of children: N/A  . Years of education: N/A   Social History Main Topics  . Smoking status: Former Smoker    Packs/day: 1.00    Years: 20.00    Types: Cigarettes    Quit date: 01/19/2005  . Smokeless tobacco: Never Used     Comment: quit in 2007  . Alcohol use No  . Drug use: No  . Sexual activity: Not Currently    Birth control/ protection: Surgical     Comment: hyst   Other Topics Concern  . None   Social History Narrative  . None   Outpatient Encounter Prescriptions as of 10/05/2016  Medication Sig  . levothyroxine (SYNTHROID, LEVOTHROID) 100 MCG tablet Take 100 mcg by mouth daily before breakfast.  . ALPRAZolam (XANAX) 1 MG tablet Take 1 mg by mouth 2 (two) times daily as needed for anxiety or sleep.   . citalopram (CELEXA) 20 MG tablet Take 20 mg by mouth daily.   Marland Kitchen lisinopril-hydrochlorothiazide (PRINZIDE,ZESTORETIC) 10-12.5 MG per tablet Take 1 tablet by mouth daily.  Marland Kitchen omeprazole (PRILOSEC) 40 MG capsule Take 40 mg by mouth daily.  . simvastatin (ZOCOR) 20 MG tablet Take 20 mg by mouth daily.   Marland Kitchen zolpidem (AMBIEN) 10 MG tablet Take 10 mg by mouth at bedtime as needed for sleep.   . [DISCONTINUED] levothyroxine (SYNTHROID, LEVOTHROID) 88 MCG tablet Take 1  tablet (88 mcg total) by mouth daily before breakfast. (Patient taking differently: Take 100 mcg by mouth daily before breakfast. )  . [DISCONTINUED] omeprazole (PRILOSEC) 40 MG capsule Take 40 mg by mouth daily.   No facility-administered encounter medications on file as of 10/05/2016.    ALLERGIES: No Known Allergies  VACCINATION STATUS:  There is no immunization history on file for this patient.  HPI 61 year old female patient with medical history as above. She is being seen in Follow-up for hypothyroidism. - She was diagnosed with hypothyroidism approximately at age 3 since when she was treated with various doses of levothyroxine , currently on  levothyroxine 100 g by mouth every morning.  She reports compliance to his medication. - Her most recent labs show improvement in her TSH and total T4. Her labs did not include free T4.   - She  lost 2 pounds since last visit after reporting progressive weight gain of 7 pounds over the last 6 months. She denies heat intolerance, palpitations, nor tremors. - She denies dysphagia, shortness of breath, nor voice change. She denies family history of thyroid cancer, however has family history of hypothyroidism in her mother. - She gives a history of prediabetes,  recent A1c 5.9%.   Review of Systems  Constitutional: + steady weight , + fatigue, no subjective hyperthermia, no subjective hypothermia Eyes: no blurry vision, no xerophthalmia ENT: no sore throat, no nodules palpated in throat, no dysphagia/odynophagia, no hoarseness Cardiovascular: no Chest Pain, no Shortness of Breath, no palpitations, no leg swelling Respiratory: no cough, no SOB Gastrointestinal: no Nausea/Vomiting/Diarhhea Musculoskeletal: no muscle/joint aches Skin: no rashes Neurological: no tremors, no numbness, no tingling, no dizziness Psychiatric: no depression, no anxiety  Objective:    BP 140/83   Pulse 82   Ht  (1.676 m)   Wt 185 lb (83.9 kg)   BMI 29.86  kg/m   Wt Readings from Last 3 Encounters:  10/05/16 185 lb (83.9 kg)  07/03/16 187 lb (84.8 kg)  09/12/15 186 lb (84.4 kg)    Physical Exam  Constitutional: Over weight for height, not in acute distress, normal state of mind Eyes: PERRLA, EOMI, no exophthalmos ENT: moist mucous membranes, no thyromegaly, no cervical lymphadenopathy Cardiovascular: normal precordial activity, Regular Rate and Rhythm, no Murmur/Rubs/Gallops Respiratory:  adequate breathing efforts, no gross chest deformity, Clear to auscultation bilaterally Gastrointestinal: abdomen soft, Non -tender, No distension, Bowel Sounds present Musculoskeletal: no gross deformities, strength intact in all four extremities Skin: moist, warm, no rashes Neurological: no tremor with outstretched hands, Deep tendon reflexes normal in all four extremities. Recent Results (from the past 2160 hour(s))  Lipid panel     Status: None   Collection Time: 09/22/16 12:00 AM  Result Value Ref Range   Triglycerides 69 40 - 160   Cholesterol 124 0 - 200   HDL 46 35 - 70   LDL Cholesterol 64   Hemoglobin A1c     Status: None   Collection Time: 09/22/16 12:00 AM  Result Value Ref Range   Hemoglobin A1C 5.9   TSH     Status: Abnormal   Collection Time: 09/22/16 12:00 AM  Result Value Ref Range   TSH 0.12 (A) 0.41 - 5.90    Comment: total t4 10.5, +TPO, TG abs    CMP     Component Value Date/Time   NA 134 (L) 06/05/2014 1925   K 3.5 06/05/2014 1925   CL 98 (L) 06/05/2014 1925   CO2 27 06/05/2014 1925   GLUCOSE 133 (H) 06/05/2014 1925   BUN 15 06/05/2014 1925   CREATININE 1.05 (H) 06/05/2014 1925   CALCIUM 9.1 06/05/2014 1925   PROT 7.3 06/05/2014 1925   ALBUMIN 4.2 06/05/2014 1925   AST 60 (H) 06/05/2014 1925   ALT 55 (H) 06/05/2014 1925   ALKPHOS 105 06/05/2014 1925   BILITOT 0.7 06/05/2014 1925   GFRNONAA 58 (L) 06/05/2014 1925   GFRAA >60 06/05/2014 1925    Lipid Panel     Component Value Date/Time   CHOL 124  09/22/2016   TRIG 69 09/22/2016   HDL 46 09/22/2016   LDLCALC 64 09/22/2016      Assessment & Plan:   1. Hypothyroidism 2. Hashimoto's thyroiditis - Her repeat thyroid function and from outside lab show improvement. She will continue to benefit from current dose of levothyroxine 100 g by mouth every morning.  - We discussed about correct intake of levothyroxine, at fasting, with water, separated by at least 30 minutes from breakfast, and separated by more than 4 hours from calcium, iron, multivitamins, acid reflux medications (PPIs). -Patient is made aware of the fact that thyroid hormone replacement is needed for life, dose to be adjusted by periodic monitoring of thyroid function tests.  3. Prediabetes  with A1c 5.9% - She will not require medication for this at this time. Suggestion is made for her to avoid simple carbohydrates  from her diet including Cakes, Sweet Desserts, Ice Cream, Soda (diet and regular), Sweet Tea, Candies, Chips, Cookies, Store Bought Juices, Alcohol in Excess of  1-2 drinks a day, Artificial Sweeteners, and "Sugar-free" Products. This will help patient to have stable blood glucose profile and potentially avoid unintended weight gain.   - I advised patient to maintain close follow up with Roma Kayser, PA-C for primary care needs. Follow up plan: Return in about 4 months (around 02/04/2017) for follow up with pre-visit labs.  Marquis Lunch, MD Phone: [phone]  Fax: [phone]   10/05/2016, 1:11 PM

## 2016-10-14 DIAGNOSIS — E039 Hypothyroidism, unspecified: Secondary | ICD-10-CM | POA: Diagnosis not present

## 2016-10-24 ENCOUNTER — Other Ambulatory Visit: Payer: Self-pay | Admitting: "Endocrinology

## 2016-10-29 DIAGNOSIS — F419 Anxiety disorder, unspecified: Secondary | ICD-10-CM | POA: Diagnosis not present

## 2016-10-29 DIAGNOSIS — I10 Essential (primary) hypertension: Secondary | ICD-10-CM | POA: Diagnosis not present

## 2016-10-29 DIAGNOSIS — E782 Mixed hyperlipidemia: Secondary | ICD-10-CM | POA: Diagnosis not present

## 2016-10-29 DIAGNOSIS — E039 Hypothyroidism, unspecified: Secondary | ICD-10-CM | POA: Diagnosis not present

## 2016-11-11 DIAGNOSIS — N390 Urinary tract infection, site not specified: Secondary | ICD-10-CM | POA: Diagnosis not present

## 2016-11-11 DIAGNOSIS — Z6828 Body mass index (BMI) 28.0-28.9, adult: Secondary | ICD-10-CM | POA: Diagnosis not present

## 2017-02-11 DIAGNOSIS — E039 Hypothyroidism, unspecified: Secondary | ICD-10-CM | POA: Diagnosis not present

## 2017-02-11 DIAGNOSIS — E785 Hyperlipidemia, unspecified: Secondary | ICD-10-CM | POA: Diagnosis not present

## 2017-02-11 DIAGNOSIS — Z008 Encounter for other general examination: Secondary | ICD-10-CM | POA: Diagnosis not present

## 2017-02-11 DIAGNOSIS — Z719 Counseling, unspecified: Secondary | ICD-10-CM | POA: Diagnosis not present

## 2017-02-11 DIAGNOSIS — I10 Essential (primary) hypertension: Secondary | ICD-10-CM | POA: Diagnosis not present

## 2017-02-17 DIAGNOSIS — R739 Hyperglycemia, unspecified: Secondary | ICD-10-CM | POA: Diagnosis not present

## 2017-02-17 DIAGNOSIS — I10 Essential (primary) hypertension: Secondary | ICD-10-CM | POA: Diagnosis not present

## 2017-02-17 DIAGNOSIS — E782 Mixed hyperlipidemia: Secondary | ICD-10-CM | POA: Diagnosis not present

## 2017-02-17 DIAGNOSIS — E039 Hypothyroidism, unspecified: Secondary | ICD-10-CM | POA: Diagnosis not present

## 2017-02-17 DIAGNOSIS — F329 Major depressive disorder, single episode, unspecified: Secondary | ICD-10-CM | POA: Diagnosis not present

## 2017-02-17 DIAGNOSIS — F419 Anxiety disorder, unspecified: Secondary | ICD-10-CM | POA: Diagnosis not present

## 2017-03-27 DIAGNOSIS — R05 Cough: Secondary | ICD-10-CM | POA: Diagnosis not present

## 2017-03-27 DIAGNOSIS — J0101 Acute recurrent maxillary sinusitis: Secondary | ICD-10-CM | POA: Diagnosis not present

## 2017-03-27 DIAGNOSIS — Z6830 Body mass index (BMI) 30.0-30.9, adult: Secondary | ICD-10-CM | POA: Diagnosis not present

## 2017-03-27 DIAGNOSIS — J111 Influenza due to unidentified influenza virus with other respiratory manifestations: Secondary | ICD-10-CM | POA: Diagnosis not present

## 2017-04-20 DIAGNOSIS — F419 Anxiety disorder, unspecified: Secondary | ICD-10-CM | POA: Diagnosis not present

## 2017-04-20 DIAGNOSIS — E782 Mixed hyperlipidemia: Secondary | ICD-10-CM | POA: Diagnosis not present

## 2017-04-20 DIAGNOSIS — I10 Essential (primary) hypertension: Secondary | ICD-10-CM | POA: Diagnosis not present

## 2017-04-20 DIAGNOSIS — E039 Hypothyroidism, unspecified: Secondary | ICD-10-CM | POA: Diagnosis not present

## 2017-04-20 DIAGNOSIS — F329 Major depressive disorder, single episode, unspecified: Secondary | ICD-10-CM | POA: Diagnosis not present

## 2017-04-20 DIAGNOSIS — M542 Cervicalgia: Secondary | ICD-10-CM | POA: Diagnosis not present

## 2017-04-20 DIAGNOSIS — R739 Hyperglycemia, unspecified: Secondary | ICD-10-CM | POA: Diagnosis not present

## 2017-06-01 DIAGNOSIS — E039 Hypothyroidism, unspecified: Secondary | ICD-10-CM | POA: Diagnosis not present

## 2017-06-07 DIAGNOSIS — R079 Chest pain, unspecified: Secondary | ICD-10-CM | POA: Diagnosis not present

## 2017-06-07 DIAGNOSIS — Z6831 Body mass index (BMI) 31.0-31.9, adult: Secondary | ICD-10-CM | POA: Diagnosis not present

## 2017-06-07 DIAGNOSIS — R609 Edema, unspecified: Secondary | ICD-10-CM | POA: Diagnosis not present

## 2017-07-05 DIAGNOSIS — J209 Acute bronchitis, unspecified: Secondary | ICD-10-CM | POA: Diagnosis not present

## 2017-07-05 DIAGNOSIS — J019 Acute sinusitis, unspecified: Secondary | ICD-10-CM | POA: Diagnosis not present

## 2017-07-05 DIAGNOSIS — Z6830 Body mass index (BMI) 30.0-30.9, adult: Secondary | ICD-10-CM | POA: Diagnosis not present

## 2017-07-27 DIAGNOSIS — I10 Essential (primary) hypertension: Secondary | ICD-10-CM | POA: Diagnosis not present

## 2017-07-27 DIAGNOSIS — Z1331 Encounter for screening for depression: Secondary | ICD-10-CM | POA: Diagnosis not present

## 2017-07-27 DIAGNOSIS — Z1389 Encounter for screening for other disorder: Secondary | ICD-10-CM | POA: Diagnosis not present

## 2017-07-27 DIAGNOSIS — E782 Mixed hyperlipidemia: Secondary | ICD-10-CM | POA: Diagnosis not present

## 2017-07-27 DIAGNOSIS — E039 Hypothyroidism, unspecified: Secondary | ICD-10-CM | POA: Diagnosis not present

## 2017-07-27 DIAGNOSIS — R739 Hyperglycemia, unspecified: Secondary | ICD-10-CM | POA: Diagnosis not present

## 2017-08-05 DIAGNOSIS — M722 Plantar fascial fibromatosis: Secondary | ICD-10-CM | POA: Diagnosis not present

## 2017-08-05 DIAGNOSIS — Z6831 Body mass index (BMI) 31.0-31.9, adult: Secondary | ICD-10-CM | POA: Diagnosis not present

## 2017-08-05 DIAGNOSIS — K047 Periapical abscess without sinus: Secondary | ICD-10-CM | POA: Diagnosis not present

## 2017-08-24 DIAGNOSIS — Z6831 Body mass index (BMI) 31.0-31.9, adult: Secondary | ICD-10-CM | POA: Diagnosis not present

## 2017-08-24 DIAGNOSIS — K146 Glossodynia: Secondary | ICD-10-CM | POA: Diagnosis not present

## 2017-09-09 DIAGNOSIS — I10 Essential (primary) hypertension: Secondary | ICD-10-CM | POA: Diagnosis not present

## 2017-09-09 DIAGNOSIS — E039 Hypothyroidism, unspecified: Secondary | ICD-10-CM | POA: Diagnosis not present

## 2017-09-09 DIAGNOSIS — Z008 Encounter for other general examination: Secondary | ICD-10-CM | POA: Diagnosis not present

## 2017-09-09 DIAGNOSIS — Z719 Counseling, unspecified: Secondary | ICD-10-CM | POA: Diagnosis not present

## 2017-09-15 DIAGNOSIS — D369 Benign neoplasm, unspecified site: Secondary | ICD-10-CM | POA: Diagnosis not present

## 2017-09-15 DIAGNOSIS — L280 Lichen simplex chronicus: Secondary | ICD-10-CM | POA: Diagnosis not present

## 2017-10-19 DIAGNOSIS — Z1231 Encounter for screening mammogram for malignant neoplasm of breast: Secondary | ICD-10-CM | POA: Diagnosis not present

## 2017-10-21 DIAGNOSIS — Z23 Encounter for immunization: Secondary | ICD-10-CM | POA: Diagnosis not present

## 2017-10-22 DIAGNOSIS — E782 Mixed hyperlipidemia: Secondary | ICD-10-CM | POA: Diagnosis not present

## 2017-10-22 DIAGNOSIS — R739 Hyperglycemia, unspecified: Secondary | ICD-10-CM | POA: Diagnosis not present

## 2017-10-22 DIAGNOSIS — E039 Hypothyroidism, unspecified: Secondary | ICD-10-CM | POA: Diagnosis not present

## 2017-10-22 DIAGNOSIS — E876 Hypokalemia: Secondary | ICD-10-CM | POA: Diagnosis not present

## 2017-10-22 DIAGNOSIS — I10 Essential (primary) hypertension: Secondary | ICD-10-CM | POA: Diagnosis not present

## 2017-10-25 ENCOUNTER — Emergency Department (HOSPITAL_COMMUNITY): Payer: BLUE CROSS/BLUE SHIELD

## 2017-10-25 ENCOUNTER — Encounter (HOSPITAL_COMMUNITY): Payer: Self-pay

## 2017-10-25 ENCOUNTER — Emergency Department (HOSPITAL_COMMUNITY)
Admission: EM | Admit: 2017-10-25 | Discharge: 2017-10-25 | Disposition: A | Discharge status: Home / Self Care | Payer: BLUE CROSS/BLUE SHIELD | Attending: Emergency Medicine | Admitting: Emergency Medicine

## 2017-10-25 ENCOUNTER — Other Ambulatory Visit: Payer: Self-pay

## 2017-10-25 DIAGNOSIS — I10 Essential (primary) hypertension: Secondary | ICD-10-CM | POA: Insufficient documentation

## 2017-10-25 DIAGNOSIS — E039 Hypothyroidism, unspecified: Secondary | ICD-10-CM | POA: Diagnosis not present

## 2017-10-25 DIAGNOSIS — J189 Pneumonia, unspecified organism: Secondary | ICD-10-CM

## 2017-10-25 DIAGNOSIS — Z87891 Personal history of nicotine dependence: Secondary | ICD-10-CM | POA: Insufficient documentation

## 2017-10-25 DIAGNOSIS — J181 Lobar pneumonia, unspecified organism: Secondary | ICD-10-CM | POA: Insufficient documentation

## 2017-10-25 DIAGNOSIS — R0602 Shortness of breath: Secondary | ICD-10-CM | POA: Diagnosis not present

## 2017-10-25 DIAGNOSIS — R509 Fever, unspecified: Secondary | ICD-10-CM | POA: Diagnosis present

## 2017-10-25 DIAGNOSIS — Z79899 Other long term (current) drug therapy: Secondary | ICD-10-CM | POA: Diagnosis not present

## 2017-10-25 LAB — CBC WITH DIFFERENTIAL/PLATELET
Basophils Absolute: 0 10*3/uL (ref 0.0–0.1)
Basophils Relative: 0 %
EOS ABS: 0 10*3/uL (ref 0.0–0.7)
Eosinophils Relative: 0 %
HEMATOCRIT: 38 % (ref 36.0–46.0)
HEMOGLOBIN: 12.5 g/dL (ref 12.0–15.0)
Lymphocytes Relative: 11 %
Lymphs Abs: 1.1 10*3/uL (ref 0.7–4.0)
MCH: 29.2 pg (ref 26.0–34.0)
MCHC: 32.9 g/dL (ref 30.0–36.0)
MCV: 88.8 fL (ref 78.0–100.0)
MONOS PCT: 7 %
Monocytes Absolute: 0.7 10*3/uL (ref 0.1–1.0)
NEUTROS ABS: 7.8 10*3/uL — AB (ref 1.7–7.7)
NEUTROS PCT: 81 %
Platelets: 197 10*3/uL (ref 150–400)
RBC: 4.28 MIL/uL (ref 3.87–5.11)
RDW: 12.9 % (ref 11.5–15.5)
WBC: 9.6 10*3/uL (ref 4.0–10.5)

## 2017-10-25 LAB — BASIC METABOLIC PANEL
ANION GAP: 9 (ref 5–15)
BUN: 9 mg/dL (ref 6–20)
CALCIUM: 8.4 mg/dL — AB (ref 8.9–10.3)
CO2: 23 mmol/L (ref 22–32)
Chloride: 98 mmol/L (ref 98–111)
Creatinine, Ser: 1 mg/dL (ref 0.44–1.00)
GFR calc non Af Amer: 60 mL/min — ABNORMAL LOW (ref >60)
Glucose, Bld: 128 mg/dL — ABNORMAL HIGH (ref 70–99)
Potassium: 3.4 mmol/L — ABNORMAL LOW (ref 3.5–5.1)
SODIUM: 130 mmol/L — AB (ref 135–145)

## 2017-10-25 LAB — I-STAT CG4 LACTIC ACID, ED: LACTIC ACID, VENOUS: 0.72 mmol/L (ref 0.5–1.9)

## 2017-10-25 MED ORDER — AZITHROMYCIN 250 MG PO TABS: 250.0000 mg | ORAL_TABLET | Freq: Every day | ORAL | 0 refills | Status: AC

## 2017-10-25 MED ORDER — ACETAMINOPHEN 500 MG PO TABS
1000.0000 mg | ORAL_TABLET | Freq: Once | ORAL | Status: AC
  Administered 2017-10-25: 1000 mg via ORAL
  Filled 2017-10-25: qty 2

## 2017-10-25 MED ORDER — SODIUM CHLORIDE 0.9 % IV SOLN
1.0000 g | Freq: Once | INTRAVENOUS | Status: AC
  Administered 2017-10-25: 1 g via INTRAVENOUS
  Filled 2017-10-25: qty 10

## 2017-10-25 MED ORDER — AZITHROMYCIN 250 MG PO TABS
500.0000 mg | ORAL_TABLET | Freq: Once | ORAL | Status: AC
  Administered 2017-10-25: 500 mg via ORAL
  Filled 2017-10-25: qty 2

## 2017-10-25 NOTE — ED Notes (Signed)
EDP at bedside  

## 2017-10-25 NOTE — ED Notes (Signed)
Pt aware a urine specimen is needed. Will notify staff when one can be obtained. 

## 2017-10-25 NOTE — ED Provider Notes (Signed)
[facility] EMERGENCY DEPARTMENT Provider Note   CSN: [id number] Arrival date & time: 10/25/17  0708     History   Chief Complaint Chief Complaint  Patient presents with  . Fever    HPI Catherine Madden is a 62 y.o. female.  HPI  The patient is a 62 year old female, she presents today with a complaint of a fever.  She reports that after getting her's flu shot last week she has now developed body aches, headache, fever, occasional coughing, myalgias.  She denies any urinary symptoms other than urinary frequency which she states is regular for her.  No rashes, no tick bites, no sore throat, states that she has had no medication prior to arrival.  Symptoms are persistent, nothing seems to make this better or worse.  She is accompanied by her sister-in-law who corroborates the patient's story  Past Medical History:  Diagnosis Date  . Anxiety   . Borderline diabetes   . Depression   . GERD (gastroesophageal reflux disease)   . High cholesterol   . Hypertension   . Thyroid disease     Patient Active Problem List   Diagnosis Date Noted  . Hashimoto's thyroiditis 10/05/2016  . Prediabetes 10/05/2016  . Hypothyroidism 07/03/2016  . Postop check 10/14/2015  . Traumatic vaginal laceration, at work 09/12/2015    Past Surgical History:  Procedure Laterality Date  . ABDOMINAL HYSTERECTOMY    . CESAREAN SECTION    . CHOLECYSTECTOMY    . PERINEAL LACERATION REPAIR N/A 09/06/2015   Procedure: Repair of Vaginal Laceration;  Surgeon: Tilda Burrow, MD;  Location: [facility];  Service: Gynecology;  Laterality: N/A;  . TUBAL LIGATION       OB History    Gravida  1   Para  1   Term  1   Preterm      AB      Living  1     SAB      TAB      Ectopic      Multiple      Live Births               Home Medications    Prior to Admission medications   Medication Sig Start Date End Date Taking? Authorizing Provider  ALPRAZolam Prudy Feeler) 1 MG tablet Take 1 mg by mouth  2 (two) times daily as needed for anxiety or sleep.  02/01/14   [provider]  azithromycin (ZITHROMAX Z-PAK) 250 MG tablet Take 1 tablet (250 mg total) by mouth daily. 500mg  PO day 1, then 250mg  PO days 205 10/25/17   Eber Hong, MD  citalopram (CELEXA) 20 MG tablet Take 20 mg by mouth daily.  02/02/14   [provider]  levothyroxine (SYNTHROID, LEVOTHROID) 100 MCG tablet Take 100 mcg by mouth daily before breakfast.    [provider]  levothyroxine (SYNTHROID, LEVOTHROID) 100 MCG tablet take 1 tablet by mouth once daily BEFORE BREAKFAST 10/26/16   Nida, Denman George, MD  lisinopril-hydrochlorothiazide (PRINZIDE,ZESTORETIC) 10-12.5 MG per tablet Take 1 tablet by mouth daily. 02/01/14   [provider]  omeprazole (PRILOSEC) 40 MG capsule Take 40 mg by mouth daily.    [provider]  simvastatin (ZOCOR) 20 MG tablet Take 20 mg by mouth daily.  02/01/14   [provider]  zolpidem (AMBIEN) 10 MG tablet Take 10 mg by mouth at bedtime as needed for sleep.  01/27/14   [provider]    Family History  Family History  Problem Relation Age of Onset  . Diabetes Father   . Heart attack Paternal Grandfather   . Diabetes Paternal Grandmother   . Other Maternal Grandfather        Black lung  . Macular degeneration Mother   . Kidney failure Brother   . Mental illness Son     Social History Social History   Tobacco Use  . Smoking status: Former Smoker    Packs/day: 1.00    Years: 20.00    Pack years: 20.00    Types: Cigarettes    Last attempt to quit: 01/19/2005    Years since quitting: 12.7  . Smokeless tobacco: Never Used  . Tobacco comment: quit in 2007  Substance Use Topics  . Alcohol use: No  . Drug use: No     Allergies   Patient has no known allergies.   Review of Systems Review of Systems  All other systems reviewed and are negative.    Physical Exam Updated Vital Signs BP (!) 154/83 (BP Location: Left  Arm)   Pulse 96   Temp (!) 101.3 F (38.5 C) (Oral)   Resp 16   Ht 1.676 m (5\' 6" )   Wt 81.6 kg   SpO2 96%   BMI 29.05 kg/m   Physical Exam  Constitutional: She appears well-developed and well-nourished. No distress.  HENT:  Head: Normocephalic and atraumatic.  Mouth/Throat: Oropharynx is clear and moist. No oropharyngeal exudate.  The oropharynx is clear and moist with no erythema exudate asymmetry or hypertrophy, phonation is normal  Eyes: Pupils are equal, round, and reactive to light. Conjunctivae and EOM are normal. Right eye exhibits no discharge. Left eye exhibits no discharge. No scleral icterus.  Neck: Normal range of motion. Neck supple. No JVD present. No thyromegaly present.  Very supple neck with no lymphadenopathy  Cardiovascular: Normal rate, regular rhythm, normal heart sounds and intact distal pulses. Exam reveals no gallop and no friction rub.  No murmur heard. Heart rate approximately 85 bpm, no murmurs  Pulmonary/Chest: Effort normal and breath sounds normal. No respiratory distress. She has no wheezes. She has no rales.  Lungs are clear bilaterally with no wheezing rhonchi or rales, speaks in full sentences, no increased work of breathing  Abdominal: Soft. Bowel sounds are normal. She exhibits no distension and no mass. There is no tenderness.  Totally soft and nontender abdomen  Musculoskeletal: Normal range of motion. She exhibits no edema or tenderness.  No edema, no rashes, soft compartments and supple joints  Lymphadenopathy:    She has no cervical adenopathy.  Neurological: She is alert. Coordination normal.  Alert, awake, follows commands without difficulty no facial droop  Skin: Skin is warm and dry. No rash noted. No erythema.  No rashes seen  Psychiatric: She has a normal mood and affect. Her behavior is normal.  Nursing note and vitals reviewed.    ED Treatments / Results  Labs (all labs ordered are listed, but only abnormal results are  displayed) Labs Reviewed  BASIC METABOLIC PANEL - Abnormal; Notable for the following components:      Result Value   Sodium 130 (*)    Potassium 3.4 (*)    Glucose, Bld 128 (*)    Calcium 8.4 (*)    GFR calc non Af Amer 60 (*)    All other components within normal limits  CBC WITH DIFFERENTIAL/PLATELET - Abnormal; Notable for the following components:   Neutro Abs 7.8 (*)    All  other components within normal limits  URINALYSIS, ROUTINE W REFLEX MICROSCOPIC  I-STAT CG4 LACTIC ACID, ED    EKG None  Radiology Dg Chest 2 View  Result Date: 10/25/2017 CLINICAL DATA:  Shortness of breath with weakness and fever for the past 3 days. EXAM: CHEST - 2 VIEW COMPARISON:  Chest x-ray from an abdominal series of February 24, 2014 FINDINGS: There is patchy airspace opacity in the right upper lobe with an area of confluence. The remainder of the right lung is clear. The left lung is clear. The heart and mediastinal structures exhibit no acute abnormalities. There is calcification in the wall of the aortic arch. There is no pleural effusion. The observed bony thorax is unremarkable. IMPRESSION: Patchy airspace opacity in the right upper lobe compatible with pneumonia. Followup PA and lateral chest X-ray is recommended in 3-4 weeks following trial of antibiotic therapy to ensure resolution and exclude underlying malignancy. Electronically Signed   By: David  [place] M.D.   On: 10/25/2017 08:27    Procedures Procedures (including critical care time)  Medications Ordered in ED Medications  cefTRIAXone (ROCEPHIN) 1 g in sodium chloride 0.9 % 100 mL IVPB (1 g Intravenous New Bag/Given 10/25/17 0919)  acetaminophen (TYLENOL) tablet 1,000 mg (1,000 mg Oral Given 10/25/17 0813)  azithromycin (ZITHROMAX) tablet 500 mg (500 mg Oral Given 10/25/17 0917)     Initial Impression / Assessment and Plan / ED Course  I have reviewed the triage vital signs and the nursing notes.  Pertinent labs & imaging results  that were available during my care of the patient were reviewed by me and considered in my medical decision making (see chart for details).  Clinical Course as of Oct 26 942  [facility] Oct 25, 2017  4098 I have personally viewed the two-view PA and lateral chest x-ray, there does appear to be a right upper lobe infiltrate.  This could be pneumonia but in the right clinical circumstance could also be a mass.  The patient will be informed of these results, this will need to be followed up to make sure it clears but in the presence of fever and cough will be treated as pneumonia.   [BM]  0941 Labs unremarkable, slight hyponatremia but no significant abnormal findings.  Has been given antibiotics, lactic acid is normal, there is no leukocytosis, the patient is stable for discharge, she is agreeable to return as needed.  Repeat pulmonary exam, she has no increased work of breathing, lung sounds are clear except for some slight rales in the right upper lobe, no wheezing, patient agreeable to get repeat x-ray in 2 weeks   [BM]    Clinical Course User Index [BM] Eber Hong, MD    At this time the patient has a fever, she has occasional coughing, her exam is otherwise unremarkable.  She is not hypotensive tachycardic or tachypneic and has no hypoxia.  Lung exam is unremarkable, this may be related to the flu vaccination however I would suspect that her symptoms would be more mild.  At this time will obtain a chest x-ray, Tylenol for the fever, urinalysis, the patient does not appear septic.  Final Clinical Impressions(s) / ED Diagnoses   Final diagnoses:  Community acquired pneumonia of right upper lobe of lung [facility])    ED Discharge Orders         Ordered    azithromycin (ZITHROMAX Z-PAK) 250 MG tablet  Daily     10/25/17 0942  Eber Hong, MD 10/25/17 [phone]

## 2017-10-25 NOTE — ED Notes (Signed)
Patient transported to X-ray 

## 2017-10-25 NOTE — ED Triage Notes (Signed)
Pt reports fever, body aches, cough, and sob since Friday.

## 2017-10-28 DIAGNOSIS — I10 Essential (primary) hypertension: Secondary | ICD-10-CM | POA: Diagnosis not present

## 2017-10-28 DIAGNOSIS — E039 Hypothyroidism, unspecified: Secondary | ICD-10-CM | POA: Diagnosis not present

## 2017-10-28 DIAGNOSIS — R739 Hyperglycemia, unspecified: Secondary | ICD-10-CM | POA: Diagnosis not present

## 2017-10-28 DIAGNOSIS — E782 Mixed hyperlipidemia: Secondary | ICD-10-CM | POA: Diagnosis not present

## 2018-02-03 DIAGNOSIS — E039 Hypothyroidism, unspecified: Secondary | ICD-10-CM | POA: Diagnosis not present

## 2018-02-03 DIAGNOSIS — R739 Hyperglycemia, unspecified: Secondary | ICD-10-CM | POA: Diagnosis not present

## 2018-02-03 DIAGNOSIS — E876 Hypokalemia: Secondary | ICD-10-CM | POA: Diagnosis not present

## 2018-02-03 DIAGNOSIS — E782 Mixed hyperlipidemia: Secondary | ICD-10-CM | POA: Diagnosis not present

## 2018-02-03 DIAGNOSIS — I10 Essential (primary) hypertension: Secondary | ICD-10-CM | POA: Diagnosis not present

## 2018-02-08 DIAGNOSIS — E782 Mixed hyperlipidemia: Secondary | ICD-10-CM | POA: Diagnosis not present

## 2018-02-08 DIAGNOSIS — F419 Anxiety disorder, unspecified: Secondary | ICD-10-CM | POA: Diagnosis not present

## 2018-02-08 DIAGNOSIS — E039 Hypothyroidism, unspecified: Secondary | ICD-10-CM | POA: Diagnosis not present

## 2018-02-08 DIAGNOSIS — F329 Major depressive disorder, single episode, unspecified: Secondary | ICD-10-CM | POA: Diagnosis not present

## 2018-03-15 DIAGNOSIS — Z008 Encounter for other general examination: Secondary | ICD-10-CM | POA: Diagnosis not present

## 2018-03-21 DIAGNOSIS — Z6829 Body mass index (BMI) 29.0-29.9, adult: Secondary | ICD-10-CM | POA: Diagnosis not present

## 2018-03-21 DIAGNOSIS — Z1211 Encounter for screening for malignant neoplasm of colon: Secondary | ICD-10-CM | POA: Diagnosis not present

## 2018-04-07 DIAGNOSIS — I10 Essential (primary) hypertension: Secondary | ICD-10-CM | POA: Diagnosis not present

## 2018-04-07 DIAGNOSIS — Z79899 Other long term (current) drug therapy: Secondary | ICD-10-CM | POA: Diagnosis not present

## 2018-04-07 DIAGNOSIS — F419 Anxiety disorder, unspecified: Secondary | ICD-10-CM | POA: Diagnosis not present

## 2018-04-07 DIAGNOSIS — E039 Hypothyroidism, unspecified: Secondary | ICD-10-CM | POA: Diagnosis not present

## 2018-04-07 DIAGNOSIS — Z90710 Acquired absence of both cervix and uterus: Secondary | ICD-10-CM | POA: Diagnosis not present

## 2018-04-07 DIAGNOSIS — G4700 Insomnia, unspecified: Secondary | ICD-10-CM | POA: Diagnosis not present

## 2018-04-07 DIAGNOSIS — K219 Gastro-esophageal reflux disease without esophagitis: Secondary | ICD-10-CM | POA: Diagnosis not present

## 2018-04-07 DIAGNOSIS — Z87891 Personal history of nicotine dependence: Secondary | ICD-10-CM | POA: Diagnosis not present

## 2018-04-07 DIAGNOSIS — Z9049 Acquired absence of other specified parts of digestive tract: Secondary | ICD-10-CM | POA: Diagnosis not present

## 2018-04-07 DIAGNOSIS — Z1211 Encounter for screening for malignant neoplasm of colon: Secondary | ICD-10-CM | POA: Diagnosis not present

## 2018-05-16 DIAGNOSIS — F329 Major depressive disorder, single episode, unspecified: Secondary | ICD-10-CM | POA: Diagnosis not present

## 2018-05-16 DIAGNOSIS — F419 Anxiety disorder, unspecified: Secondary | ICD-10-CM | POA: Diagnosis not present

## 2018-05-16 DIAGNOSIS — E039 Hypothyroidism, unspecified: Secondary | ICD-10-CM | POA: Diagnosis not present

## 2018-05-16 DIAGNOSIS — E782 Mixed hyperlipidemia: Secondary | ICD-10-CM | POA: Diagnosis not present

## 2018-08-22 DIAGNOSIS — M545 Low back pain: Secondary | ICD-10-CM | POA: Diagnosis not present

## 2018-08-22 DIAGNOSIS — Z6830 Body mass index (BMI) 30.0-30.9, adult: Secondary | ICD-10-CM | POA: Diagnosis not present

## 2018-08-22 DIAGNOSIS — S39012A Strain of muscle, fascia and tendon of lower back, initial encounter: Secondary | ICD-10-CM | POA: Diagnosis not present

## 2018-08-23 DIAGNOSIS — E039 Hypothyroidism, unspecified: Secondary | ICD-10-CM | POA: Diagnosis not present

## 2018-08-23 DIAGNOSIS — Z1331 Encounter for screening for depression: Secondary | ICD-10-CM | POA: Diagnosis not present

## 2018-08-23 DIAGNOSIS — F329 Major depressive disorder, single episode, unspecified: Secondary | ICD-10-CM | POA: Diagnosis not present

## 2018-08-23 DIAGNOSIS — F419 Anxiety disorder, unspecified: Secondary | ICD-10-CM | POA: Diagnosis not present

## 2018-08-23 DIAGNOSIS — Z1389 Encounter for screening for other disorder: Secondary | ICD-10-CM | POA: Diagnosis not present

## 2018-08-23 DIAGNOSIS — E782 Mixed hyperlipidemia: Secondary | ICD-10-CM | POA: Diagnosis not present

## 2018-12-31 IMAGING — DX DG CHEST 2V
2 series · 2 of 2 positions shown · non-contrast
Comparison: Chest x-ray from an abdominal series of February 24, 2014

CLINICAL DATA: Shortness of breath with weakness and fever for the
past 3 days.

EXAM:
CHEST - 2 VIEW

[chest pa]
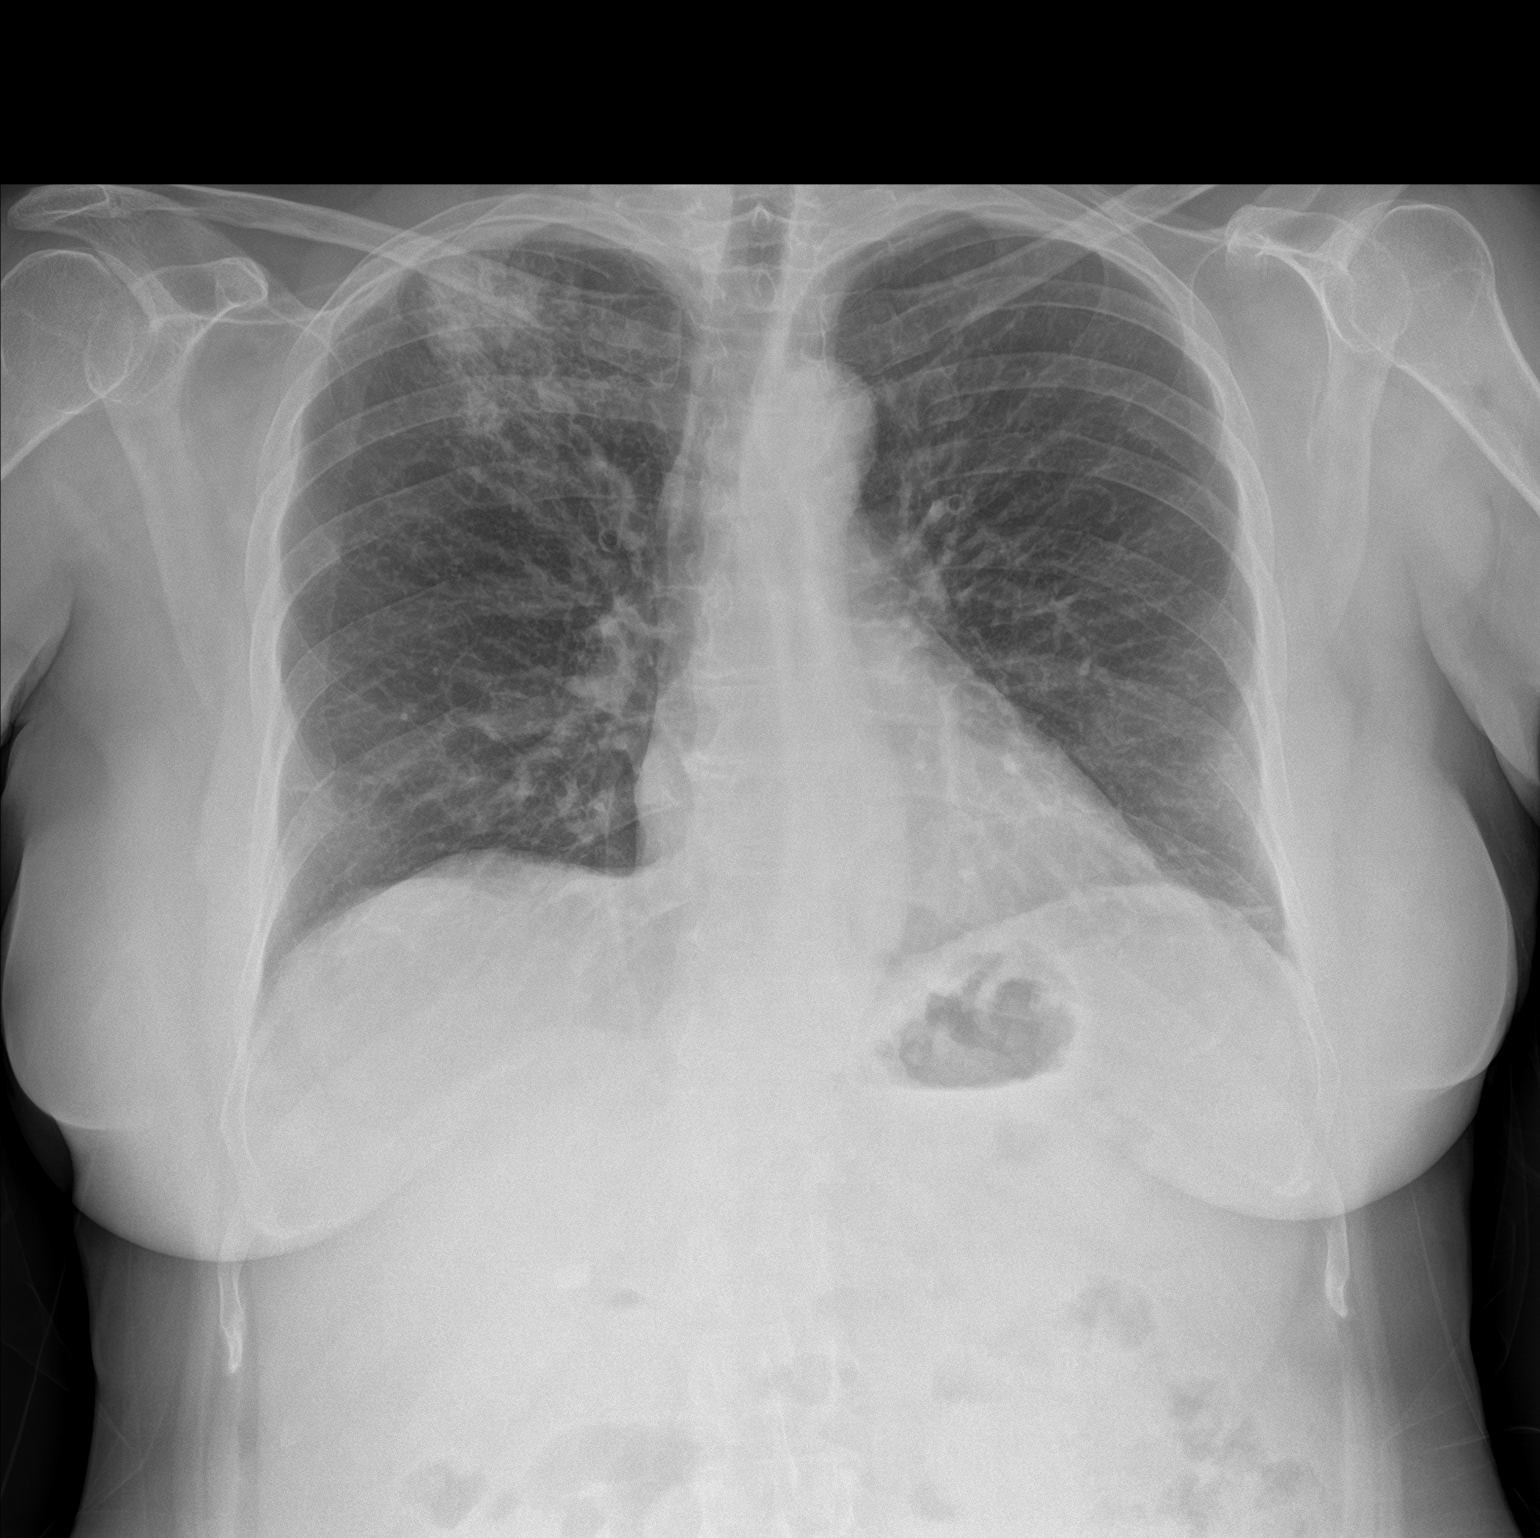

[chest lat]
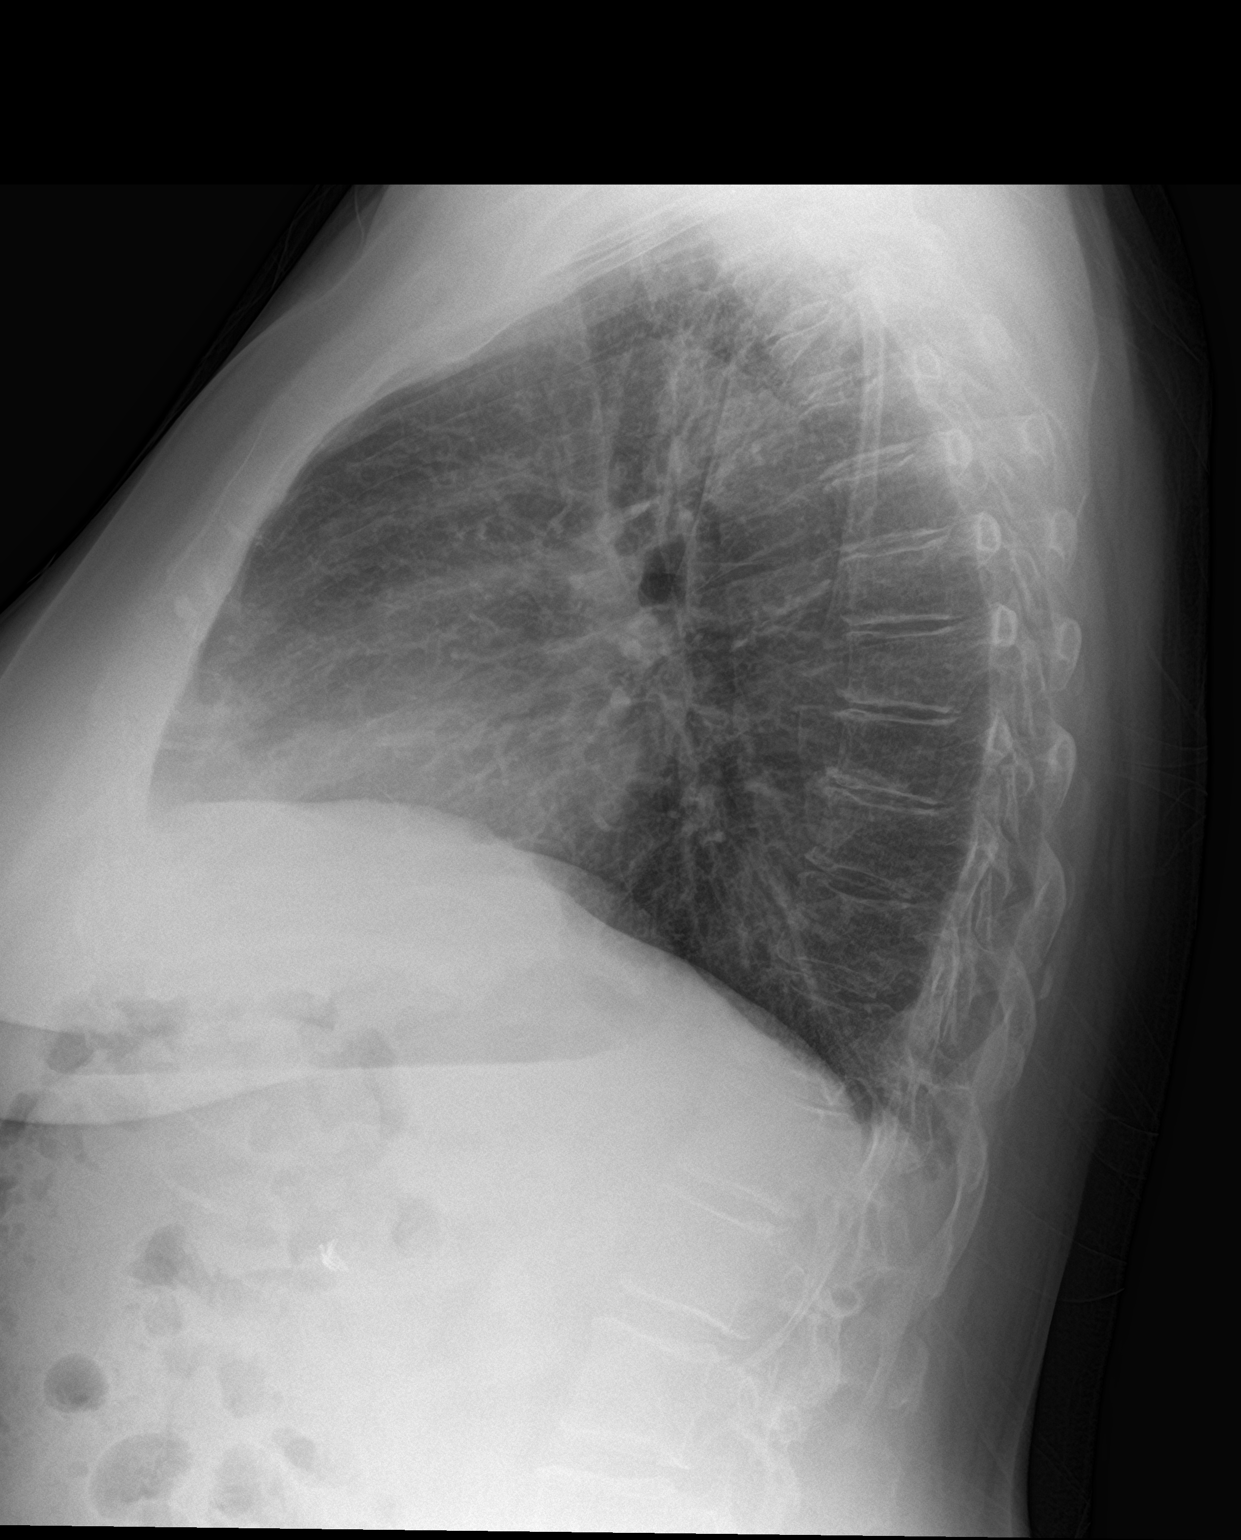

[2 of 2 positions shown; findings below may reference images not displayed]

FINDINGS: There is patchy airspace opacity in the right upper lobe with an
area of confluence. The remainder of the right lung is clear. The
left lung is clear. The heart and mediastinal structures exhibit no
acute abnormalities. There is calcification in the wall of the
aortic arch. There is no pleural effusion. The observed bony thorax
is unremarkable.
IMPRESSION: Patchy airspace opacity in the right upper lobe compatible with
pneumonia. Followup PA and lateral chest X-ray is recommended in 3-4
weeks following trial of antibiotic therapy to ensure resolution and
exclude underlying malignancy.
